# Patient Record
Sex: Female | Born: 1980 | Smoking: Never smoker
Health system: Southern US, Community
[De-identification: ages and names within clinical notes are randomized; demographics above are authoritative.]

---

## 2014-04-29 ENCOUNTER — Other Ambulatory Visit (HOSPITAL_COMMUNITY)
Admission: RE | Admit: 2014-04-29 | Discharge: 2014-04-29 | Disposition: A | Discharge status: Home / Self Care | Payer: BC Managed Care – PPO | Source: Ambulatory Visit | Attending: Family Medicine | Admitting: Family Medicine

## 2014-04-29 DIAGNOSIS — R87810 Cervical high risk human papillomavirus (HPV) DNA test positive: Secondary | ICD-10-CM | POA: Insufficient documentation

## 2014-04-29 DIAGNOSIS — Z1151 Encounter for screening for human papillomavirus (HPV): Secondary | ICD-10-CM | POA: Insufficient documentation

## 2014-04-29 DIAGNOSIS — Z01411 Encounter for gynecological examination (general) (routine) with abnormal findings: Secondary | ICD-10-CM | POA: Diagnosis not present

## 2014-12-23 ENCOUNTER — Other Ambulatory Visit (HOSPITAL_COMMUNITY)
Admission: RE | Admit: 2014-12-23 | Discharge: 2014-12-23 | Disposition: A | Discharge status: Home / Self Care | Payer: BC Managed Care – PPO | Source: Ambulatory Visit | Attending: Family Medicine | Admitting: Family Medicine

## 2014-12-23 ENCOUNTER — Other Ambulatory Visit: Payer: Self-pay | Admitting: Family Medicine

## 2014-12-23 DIAGNOSIS — Z1151 Encounter for screening for human papillomavirus (HPV): Secondary | ICD-10-CM | POA: Insufficient documentation

## 2014-12-23 DIAGNOSIS — Z01411 Encounter for gynecological examination (general) (routine) with abnormal findings: Secondary | ICD-10-CM | POA: Diagnosis present

## 2014-12-27 LAB — CYTOLOGY - PAP

## 2015-12-28 ENCOUNTER — Other Ambulatory Visit (HOSPITAL_COMMUNITY)
Admission: RE | Admit: 2015-12-28 | Discharge: 2015-12-28 | Disposition: A | Discharge status: Home / Self Care | Payer: BC Managed Care – PPO | Source: Ambulatory Visit | Attending: Family Medicine | Admitting: Family Medicine

## 2015-12-28 ENCOUNTER — Other Ambulatory Visit: Payer: Self-pay | Admitting: Family Medicine

## 2015-12-28 DIAGNOSIS — Z113 Encounter for screening for infections with a predominantly sexual mode of transmission: Secondary | ICD-10-CM | POA: Insufficient documentation

## 2015-12-28 DIAGNOSIS — Z01419 Encounter for gynecological examination (general) (routine) without abnormal findings: Secondary | ICD-10-CM | POA: Insufficient documentation

## 2015-12-28 DIAGNOSIS — N760 Acute vaginitis: Secondary | ICD-10-CM | POA: Diagnosis present

## 2015-12-28 DIAGNOSIS — Z1151 Encounter for screening for human papillomavirus (HPV): Secondary | ICD-10-CM | POA: Insufficient documentation

## 2015-12-29 LAB — CYTOLOGY - PAP

## 2018-06-04 ENCOUNTER — Other Ambulatory Visit: Payer: Self-pay | Admitting: Family Medicine

## 2018-06-04 ENCOUNTER — Other Ambulatory Visit (HOSPITAL_COMMUNITY)
Admission: RE | Admit: 2018-06-04 | Discharge: 2018-06-04 | Disposition: A | Discharge status: Home / Self Care | Payer: BC Managed Care – PPO | Source: Ambulatory Visit | Attending: Family Medicine | Admitting: Family Medicine

## 2018-06-04 DIAGNOSIS — Z124 Encounter for screening for malignant neoplasm of cervix: Secondary | ICD-10-CM | POA: Insufficient documentation

## 2018-06-05 LAB — CYTOLOGY - PAP
Diagnosis: NEGATIVE
HPV (WINDOPATH): NOT DETECTED

## 2020-11-29 ENCOUNTER — Encounter (HOSPITAL_COMMUNITY): Payer: Self-pay

## 2020-11-29 ENCOUNTER — Emergency Department (HOSPITAL_COMMUNITY)
Admission: EM | Admit: 2020-11-29 | Discharge: 2020-11-29 | Disposition: A | Discharge status: Home / Self Care | Payer: BC Managed Care – PPO | Attending: Emergency Medicine | Admitting: Emergency Medicine

## 2020-11-29 ENCOUNTER — Emergency Department (HOSPITAL_COMMUNITY): Payer: BC Managed Care – PPO

## 2020-11-29 DIAGNOSIS — R569 Unspecified convulsions: Secondary | ICD-10-CM | POA: Insufficient documentation

## 2020-11-29 DIAGNOSIS — R251 Tremor, unspecified: Secondary | ICD-10-CM | POA: Diagnosis not present

## 2020-11-29 LAB — CBC
HCT: 36.4 % (ref 36.0–46.0)
Hemoglobin: 11.3 g/dL — ABNORMAL LOW (ref 12.0–15.0)
MCH: 25.2 pg — ABNORMAL LOW (ref 26.0–34.0)
MCHC: 31 g/dL (ref 30.0–36.0)
MCV: 81.1 fL (ref 80.0–100.0)
Platelets: 464 10*3/uL — ABNORMAL HIGH (ref 150–400)
RBC: 4.49 MIL/uL (ref 3.87–5.11)
RDW: 17 % — ABNORMAL HIGH (ref 11.5–15.5)
WBC: 10.1 10*3/uL (ref 4.0–10.5)
nRBC: 0 % (ref 0.0–0.2)

## 2020-11-29 LAB — BASIC METABOLIC PANEL
Anion gap: 9 (ref 5–15)
BUN: 16 mg/dL (ref 6–20)
CO2: 23 mmol/L (ref 22–32)
Calcium: 9.2 mg/dL (ref 8.9–10.3)
Chloride: 104 mmol/L (ref 98–111)
Creatinine, Ser: 0.93 mg/dL (ref 0.44–1.00)
GFR, Estimated: >60 mL/min (ref >60)
Glucose, Bld: 82 mg/dL (ref 70–99)
Potassium: 4.2 mmol/L (ref 3.5–5.1)
Sodium: 136 mmol/L (ref 135–145)

## 2020-11-29 LAB — I-STAT BETA HCG BLOOD, ED (MC, WL, AP ONLY): I-stat hCG, quantitative: <5 m[IU]/mL (ref <5)

## 2020-11-29 LAB — T4, FREE: Free T4: 0.98 ng/dL (ref 0.61–1.12)

## 2020-11-29 LAB — TSH: TSH: 1.081 u[IU]/mL (ref 0.350–4.500)

## 2020-11-29 NOTE — ED Triage Notes (Signed)
Pt arrived via EMS, from home, no hx of seizures. Witnessed by family, states it was "full body shaking" approx 2 minutes. On couch entire time, no head strike. Per EMS, pt postictal on arrival.    18 L AC

## 2020-11-29 NOTE — Discharge Instructions (Addendum)
Avoid driving or other activities as we discussed until evaluated by a neurologist.  Return to the ED if you have any recurrent episodes.  Call the neurologist office tomorrow to schedule an appointment

## 2020-11-29 NOTE — ED Notes (Signed)
Discharged no concerns at this time.  °

## 2020-11-29 NOTE — ED Provider Notes (Signed)
[facility]-EMERGENCY DEPT Provider Note   CSN: [id number] Arrival date & time: 11/29/20  1518     History Chief Complaint  Patient presents with   Seizures    Sabrina Hoover is a 40 y.o. female.   Seizures  Patient presented to the ED for evaluation of a probable seizure.  Patient states she remembers being at home watching TV.  She does not recall feeling ill or sick but maybe she did feel little bit lightheaded.  Next thing she remembers that the paramedics at her home.  According to family members and EMS they witnessed a full body shaking episode lasting for 2 minutes.  Patient was unresponsive.  She was on the couch and did not strike her head or injure herself.  When EMS arrived she was confused.  Patient denies having episodes like this before.    History reviewed. No pertinent past medical history.  There are no problems to display for this patient.   History reviewed. No pertinent surgical history.   OB History   No obstetric history on file.     History reviewed. No pertinent family history.  Social History   Tobacco Use   Smoking status: Never   Smokeless tobacco: Never    Home Medications Prior to Admission medications   Not on File    Allergies    Patient has no known allergies.  Review of Systems   Review of Systems  Neurological:  Positive for seizures.  All other systems reviewed and are negative.  Physical Exam Updated Vital Signs BP 128/79   Pulse 86   Temp 98.2 F (36.8 C) (Oral)   Resp 14   LMP 11/16/2020   SpO2 100%   Physical Exam Vitals and nursing note reviewed.  Constitutional:      General: She is not in acute distress.    Appearance: She is well-developed.  HENT:     Head: Normocephalic and atraumatic.     Right Ear: External ear normal.     Left Ear: External ear normal.  Eyes:     General: No scleral icterus.       Right eye: No discharge.        Left eye: No discharge.     Conjunctiva/sclera:  Conjunctivae normal.     Comments: proptosis  Neck:     Trachea: No tracheal deviation.  Cardiovascular:     Rate and Rhythm: Normal rate and regular rhythm.  Pulmonary:     Effort: Pulmonary effort is normal. No respiratory distress.     Breath sounds: Normal breath sounds. No stridor. No wheezing or rales.  Abdominal:     General: Bowel sounds are normal. There is no distension.     Palpations: Abdomen is soft.     Tenderness: There is no abdominal tenderness. There is no guarding or rebound.  Musculoskeletal:        General: No tenderness or deformity.     Cervical back: Neck supple.  Skin:    General: Skin is warm and dry.     Findings: No rash.  Neurological:     General: No focal deficit present.     Mental Status: She is alert.     Cranial Nerves: No cranial nerve deficit (no facial droop, extraocular movements intact, no slurred speech).     Sensory: No sensory deficit.     Motor: No abnormal muscle tone or seizure activity.     Coordination: Coordination normal.  Psychiatric:  Mood and Affect: Mood normal.    ED Results / Procedures / Treatments   Labs (all labs ordered are listed, but only abnormal results are displayed) Labs Reviewed  CBC - Abnormal; Notable for the following components:      Result Value   Hemoglobin 11.3 (*)    MCH 25.2 (*)    RDW 17.0 (*)    Platelets 464 (*)    All other components within normal limits  BASIC METABOLIC PANEL  TSH  T4, FREE  I-STAT BETA HCG BLOOD, ED ([facility], [facility], [facility] ONLY)  CBG MONITORING, ED    EKG None  Radiology CT HEAD WO CONTRAST  Result Date: 11/29/2020 CLINICAL DATA:  Seizure, nontraumatic. Additional history provided: Seizure today, patient currently reports headache. History of seizures in the past. EXAM: CT HEAD WITHOUT CONTRAST TECHNIQUE: Contiguous axial images were obtained from the base of the skull through the vertex without intravenous contrast. COMPARISON:  No pertinent prior exams available for  comparison. FINDINGS: Brain: Cerebral volume is normal. There is no acute intracranial hemorrhage. No demarcated cortical infarct. No extra-axial fluid collection. No evidence of an intracranial mass. No midline shift. Partially empty sella turcica. Vascular: No hyperdense vessel. Skull: Normal. Negative for fracture or focal lesion. Sinuses/Orbits: Visualized orbits show no acute finding. No significant paranasal sinus disease at the imaged levels. IMPRESSION: No evidence of acute intracranial abnormality. Electronically Signed   By: Jackey Loge DO   On: 11/29/2020 16:47    Procedures Procedures   Medications Ordered in ED Medications - No data to display  ED Course  I have reviewed the triage vital signs and the nursing notes.  Pertinent labs & imaging results that were available during my care of the patient were reviewed by me and considered in my medical decision making (see chart for details).  Clinical Course as of 11/30/20 [id number]  Tue Nov 29, 2020  1815 Head CT without acute findings [JK]  1815 Metabolic panel normal.  TSH normal.  Pregnancy test negative [JK]  1815 CBC normal except mild decrease in hemoglobin [JK]    Clinical Course User Index [JK] [name], MD   MDM Rules/Calculators/A&P                          Pt presents with new onset seizure.  Back to baseline in the ED.  No focal neuro deficits.  ED workup reassuring.   No signs of infection, mass, hemorrhage.  Monitored for several hours.  No recurrent seizure.  Outpt neurology referral.  Discussed driving precautions, return for recurrent seizures. Final Clinical Impression(s) / ED Diagnoses Final diagnoses:  Seizure-like activity (HCC)    Rx / DC Orders ED Discharge Orders          Ordered    Ambulatory referral to Neurology       Comments: An appointment is requested in approximately: 1 week New onset seizure.  ED referral   11/29/20 2019             [name], MD 11/30/20 [phone]

## 2020-12-06 ENCOUNTER — Telehealth: Payer: Self-pay | Admitting: Neurology

## 2020-12-06 ENCOUNTER — Encounter: Payer: Self-pay | Admitting: Neurology

## 2020-12-06 ENCOUNTER — Other Ambulatory Visit: Payer: Self-pay

## 2020-12-06 ENCOUNTER — Ambulatory Visit: Payer: BC Managed Care – PPO | Admitting: Neurology

## 2020-12-06 DIAGNOSIS — R569 Unspecified convulsions: Secondary | ICD-10-CM | POA: Diagnosis not present

## 2020-12-06 NOTE — Patient Instructions (Signed)
Seizure, Adult °A seizure is a sudden burst of abnormal electrical and chemical activity in the brain. Seizures usually last from 30 seconds to 2 minutes.  °What are the causes? °Common causes of this condition include: °Fever or infection. °Problems that affect the brain. These may include: °A brain or head injury. °Bleeding in the brain. °A brain tumor. °Low levels of blood sugar or salt. °Kidney problems or liver problems. °Conditions that are passed from parent to child (are inherited). °Problems with a substance, such as: °Having a reaction to a drug or a medicine. °Stopping the use of a substance all of a sudden (withdrawal). °A stroke. °Disorders that affect how you develop. °Sometimes, the cause may not be known.  °What increases the risk? °Having someone in your family who has epilepsy. In this condition, seizures happen again and again over time. They have no clear cause. °Having had a tonic-clonic seizure before. This type of seizure causes you to: °Tighten the muscles of the whole body. °Lose consciousness. °Having had a head injury or strokes before. °Having had a lack of oxygen at birth. °What are the signs or symptoms? °There are many types of seizures. The symptoms vary depending on the type of seizure you have. °Symptoms during a seizure °Shaking that you cannot control (convulsions) with fast, jerky movements of muscles. °Stiffness of the body. °Breathing problems. °Feeling mixed up (confused). °Staring or not responding to sound or touch. °Head nodding. °Eyes that blink, flutter, or move fast. °Drooling, grunting, or making clicking sounds with your mouth °Losing control of when you pee or poop. °Symptoms before a seizure °Feeling afraid, nervous, or worried. °Feeling like you may vomit. °Feeling like: °You are moving when you are not. °Things around you are moving when they are not. °Feeling like you saw or heard something before (déjà vu). °Odd tastes or smells. °Changes in how you see. You may  see flashing lights or spots. °Symptoms after a seizure °Feeling confused. °Feeling sleepy. °Headache. °Sore muscles. °How is this treated? °If your seizure stops on its own, you will not need treatment. If your seizure lasts longer than 5 minutes, you will normally need treatment. Treatment may include: °Medicines given through an IV tube. °Avoiding things, such as medicines, that are known to cause your seizures. °Medicines to prevent seizures. °A device to prevent or control seizures. °Surgery. °A diet low in carbohydrates and high in fat (ketogenic diet). °Follow these instructions at home: °Medicines °Take over-the-counter and prescription medicines only as told by your doctor. °Avoid foods or drinks that may keep your medicine from working, such as alcohol. °Activity °Follow instructions about driving, swimming, or doing things that would be dangerous if you had another seizure. Wait until your doctor says it is safe for you to do these things. °If you live in the [place] ask your local department of motor vehicles when you can drive. °Get a lot of rest. °Teaching others ° °Teach friends and family what to do when you have a seizure. They should: °Help you get down to the ground. °Protect your head and body. °Loosen any clothing around your neck. °Turn you on your side. °Know whether or not you need emergency care. °Stay with you until you are better. °Also, tell them what not to do if you have a seizure. Tell them: °They should not hold you down. °They should not put anything in your mouth. °General instructions °Avoid anything that gives you seizures. °Keep a seizure diary. Write down: °What you remember   about each seizure. °What you think caused each seizure. °Keep all follow-up visits. °Contact a doctor if: °You have another seizure or seizures. Call the doctor each time you have a seizure. °The pattern of your seizures changes. °You keep having seizures with treatment. °You have symptoms of being sick or  having an infection. °You are not able to take your medicine. °Get help right away if: °You have any of these problems: °A seizure that lasts longer than 5 minutes. °Many seizures in a row and you do not feel better between seizures. °A seizure that makes it harder to breathe. °A seizure and you can no longer speak or use part of your body. °You do not wake up right after a seizure. °You get hurt during a seizure. °You feel confused or have pain right after a seizure. °These symptoms may be an emergency. Get help right away. Call your local emergency services (911 in the [place] °Do not wait to see if the symptoms will go away. °Do not drive yourself to the hospital. °Summary °A seizure is a sudden burst of abnormal electrical and chemical activity in the brain. Seizures normally last from 30 seconds to 2 minutes. °Causes of seizures include illness, injury to the head, low levels of blood sugar or salt, and certain conditions. °Most seizures will stop on their own in less than 5 minutes. Seizures that last longer than 5 minutes are a medical emergency and need treatment right away. °Many medicines are used to treat seizures. Take over-the-counter and prescription medicines only as told by your doctor. °This information is not intended to replace advice given to you by your health care provider. Make sure you discuss any questions you have with your health care provider. °Document Revised: 11/13/2019 Document Reviewed: 11/13/2019 °Elsevier Patient Education © 2022 [employer] Inc[employer] ° °

## 2020-12-06 NOTE — Progress Notes (Signed)
Provider:  Melvyn Novas, M D  Referring Provider: [name], MD Primary Care Physician:  Sigmund Hazel, MD  Chief Complaint  Patient presents with   New Patient (Initial Visit)    Pt with mom, rm 12 was in the hospital recently for ? Seizure like activity. Around May is when she noted that something was off. She would suddenly have a headache/foggy headed and difficulty concentrating and getting words out. Denies hx of seizures. Last week mom witnessed her on her phone and her head turned to the rt, eyes went back in head,shaking breathing hard, foaming at mouth. EMS was called. HEAD CT completed    HPI:  Sabrina Hoover is a 40 y.o. female  with african- american heritage is seen here upon ED  referral [name], MD   Based on the emergency room referral note the patient must have been out for a couple of minutes at least she remembers love being at home watching TV on her couch she did not feel sick and then woke up to find the paramedics were in her home.  Family members and EMS witnessed her mouth twisting, her eyes rolling- they stayed open, she was breathing very hard- and then full body stiffness- trembling ,shaking or tonic-clonic activity  and she did  bite her tongue.  She was unresponsive after about 2 minutes of convulsion.  The situation was confusing to her - EMS , asked repeatedly the same question. There was no incontinence and she has no history of head injury and did not suffer a fall or injury in the seizure.     There is no pertinent past seizure history and there is no family history of epilepsy.  He the patient in the emergency room had normal oxygen levels normal pulse rate blood pressure was 128/79 temperature was normal respiration was regular at 49 minutes not elevated.  She was in no acute distress and well developed.  No peripheral pain.  There was no extraocular abnormality no facial droop no slurred speech and her mood was normal she did not appear confused to  the emergency room staff.  The labs that were taken but very basic hemoglobin MCH platelets basic metabolic panel TSH there was no toxicology screen.  An EKG was not obtained, a CT head without contrast was ordered and again it was a nontraumatic seizure without any focal neurologic abnormalities upon returning to baseline this was a normal CT.  The patient also had to undergo a pregnancy test which was negative.  The day was not unusual a in any way. .     Review of Systems:  Little episodes of foggy headaches, lasting less than a minute, and talking " senselessly: started in may , about once a week.   Out of a complete 14 system review, the patient complains of only the following symptoms, and all other reviewed systems are negative.  Single seizure-  Social History   Socioeconomic History   Marital status: Unknown    Spouse name: Not on file   Number of children: Not on file   Years of education: Not on file   Highest education level: Not on file  Occupational History   Not on file  Tobacco Use   Smoking status: Never   Smokeless tobacco: Never  Substance and Sexual Activity   Alcohol use: Not on file   Drug use: Not on file   Sexual activity: Not on file  Other Topics Concern  Not on file  Social History Narrative   Not on file   Social Determinants of Health   Financial Resource Strain: Not on file  Food Insecurity: Not on file  Transportation Needs: Not on file  Physical Activity: Not on file  Stress: Not on file  Social Connections: Not on file  Intimate Partner Violence: Not on file    Allergies as of 12/06/2020   (No Known Allergies)    Vitals: LMP 11/16/2020  Last Weight:  Wt Readings from Last 1 Encounters:  No data found for Wt   Last Height:   Ht Readings from Last 1 Encounters:  No data found for Ht    Physical exam:  General: The patient is awake, alert and appears not in acute distress. The patient is well groomed. Head: Normocephalic,  atraumatic. Neck is supple.  Mallampati 3, nCardiovascular:  Regular rate and rhythm, without  murmurs or carotid bruit, and without distended neck veins. Respiratory: Lungs are clear to auscultation. Skin:  Without evidence of edema, or rash Trunk: BMI is elevated and patient  has normal posture.  Neurologic exam : The patient is awake and alert, oriented to place and time.  Memory subjective  described as intact. There is a normal attention span & concentration ability. Speech is fluent without dysarthria, dysphonia or aphasia. Mood and affect are appropriate.  Cranial nerves: Pupils are equal and briskly reactive to light.  Funduscopic exam without  evidence of pallor or edema. Extraocular movements  in vertical and horizontal planes intact and without nystagmus.  Visual fields by finger perimetry are intact. Hearing to finger rub intact.  Facial sensation intact to fine touch. Facial motor strength is symmetric and tongue and uvula move midline. Right tongue rim bite mark, healed-  Tongue protrusion into either cheek is normal. Shoulder shrug is normal.   Motor exam:   Normal tone ,muscle bulk and symmetric  strength in all extremities. Her left hand grip strength was lower than right, no pronator drift.  Sensory:  Fine touch and vibration were tested in all extremities. Proprioception was normal.  Coordination: Rapid alternating movements in the fingers/hands were normal. Finger-to-nose maneuver  normal without evidence of ataxia, dysmetria or tremor.  Gait and station: Patient walks without assistive device and is able unassisted to climb up to the exam table. Strength within normal limits. Stance is stable and normal. Tandem gait is unfragmented. Romberg testing is negative   Deep tendon reflexes: in the  upper and lower extremities are symmetric and intact. Babinski maneuver response is  downgoing.   Assessment:  After physical and neurologic examination, review of laboratory  studies, imaging, neurophysiology testing and pre-existing records, assessment is that of :  Single , unprovoked seizure-  Plan:  Treatment plan and additional workup :  I believe that the patient indeed suffered a seizure given the tongue bite, the beginning as a mouth twisting face, her eyes remained open, the hands were fisted, then followed by turning and rigid body manifestation with  a tongue bite.  A single seizure is not usually treated with antiepileptic medications, there are too many individuals and will have 1 seizure in the whole lifetime and there is no good guidance when to take the patient back off medication should it be initiated.  My goal today would be to order an MRI of the brain with a special attention to the frontal temporal region, a full metabolic panel, and an EEG "brainwave" test.  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   We spoke about operating machinery, about swimming and eliminating all but one pillow from her bed.  No meds.   Porfirio Mylar Kamiah Fite MD 12/06/2020

## 2020-12-06 NOTE — Telephone Encounter (Signed)
45 mins MRI brain w/wo contrast Dr. Thomasene Lot Berkley Harvey: [phone] exp. 01/04/21 scheduled at [facility] 12/13/20

## 2020-12-13 ENCOUNTER — Ambulatory Visit: Payer: BC Managed Care – PPO

## 2020-12-13 ENCOUNTER — Other Ambulatory Visit: Payer: Self-pay | Admitting: Neurology

## 2020-12-13 DIAGNOSIS — R569 Unspecified convulsions: Secondary | ICD-10-CM | POA: Diagnosis not present

## 2020-12-14 ENCOUNTER — Ambulatory Visit (INDEPENDENT_AMBULATORY_CARE_PROVIDER_SITE_OTHER): Payer: BC Managed Care – PPO

## 2020-12-14 DIAGNOSIS — R569 Unspecified convulsions: Secondary | ICD-10-CM | POA: Diagnosis not present

## 2020-12-16 NOTE — Progress Notes (Signed)
Single seizure, non -traumatic  7.26.2022 study ;Unremarkable MRI brain without contrast.  No acute findings.    INTERPRETING PHYSICIAN: Suanne Marker, MD [id number] 18:10 Certified in Neurology, Neurophysiology and Neuroimaging

## 2020-12-19 ENCOUNTER — Telehealth: Payer: Self-pay | Admitting: Neurology

## 2020-12-19 NOTE — Telephone Encounter (Signed)
EEG was normal.

## 2020-12-19 NOTE — Telephone Encounter (Signed)
Pt called, have seen my MRI results on MyChart, but do not understand the results.Have had another seizure. Would like a call from the nurse.

## 2020-12-19 NOTE — Telephone Encounter (Signed)
Called the pt and advised that the MRI of the brain and EEG (she completed last wed) have not officially been read yet.  Pt states that the seizure occurred on early sat morning during sleep. Informed the patient that I will inform Dr Vickey Huger of this information and advised that once she reviews the results of the tests I will call back with that. Pt verbalized understanding.

## 2020-12-19 NOTE — Progress Notes (Signed)
no epileptiform activity no rhythmicity or periodicity.  Soon after hyperventilation was concluded the study was completed.  The patient did not become drowsy after any of these maneuvers and was always quick to respond to any questions of orientation.    Conclusion this is a normal EEG for the patient's age and conscious state.

## 2020-12-19 NOTE — Procedures (Signed)
Mrs. Sabrina Hoover is a 40 year old female patient who was watching TV at home on her couch and the next thing she remembers is that she was surrounded by paramedics in her home.  Family members and the EMS upon arrival witnessed her's seizing her mouth twisting, her eyes rolling but staying open.  She was breathing mechanically and very hard.  She did bite her tongue.  Total time of convulsion was about 2 minutes with an additional at least 8 minutes of confusion.  This EEG was performed under the international 10-20 placement system of electrodes, electric activity was acquired as a sampling rate of 500 Hz.  There is no synchronous video recording available. single electrode for heart rate and rhythm is part of this set up.  The total recording time for the study was 32 minutes and 15 seconds.  A posterior dominant rhythm was well-established as soon as the patient kept her eyes closed.  During eye opening there was a very high amplitude noticed which attenuated.  Heart rate stayed throughout this recording in normal sinus rhythm between 64 and 78 bpm.  The first maneuver was photic driving to which the patient responded positive at all frequencies there was excessive eye blink artifact noticed as well.  Post photic driving the eye blink returned and the patient must have continued for about 2 minutes before hyperventilation begun.  There was good effort and no significant amplitude buildup noticed.  Again the amplitude was already high, again we see eye blink artifact no epileptiform activity no rhythmicity or periodicity.  Soon after hyperventilation was concluded the study was completed.  The patient did not become drowsy after any of these maneuvers and was always quick to respond to any questions of orientation.    Conclusion this is a normal EEG for the patient's age and conscious state.

## 2020-12-20 ENCOUNTER — Other Ambulatory Visit: Payer: Self-pay | Admitting: Neurology

## 2020-12-20 MED ORDER — LEVETIRACETAM 500 MG PO TABS: ORAL_TABLET | ORAL | 0 refills | Status: DC

## 2020-12-20 NOTE — Telephone Encounter (Signed)
Called the patient back to advise that Dr Vickey Huger did review the EEG results. Informed the patient the EEG was normal finding. "no epileptiform activity no rhythmicity or periodicity.  Soon after hyperventilation was concluded the study was completed.  The patient did not become drowsy after any of these maneuvers and was always quick to respond to any questions of orientation.       Conclusion this is a normal EEG for the patient's age and conscious state." Given the pt has had another event that has occurred the patient should start keppra. Informed that keppra 500 mg BID will be started initially and she should do that for 14 days. If tolerates well she may then increase to 1000 mg BID.  Reviewed side effects with the pt and advised that she should notify if she continues to have seizure like event.  Pt has upcoming apt on 10/3 which will we will keep for now.  Pt verbalized understanding. Pt had no questions at this time but was encouraged to call back if questions arise.

## 2020-12-20 NOTE — Addendum Note (Signed)
Addended by: Judi Cong on: 12/20/2020 09:54 AM   Modules accepted: Orders

## 2021-01-02 ENCOUNTER — Encounter: Payer: Self-pay | Admitting: Neurology

## 2021-01-12 ENCOUNTER — Other Ambulatory Visit: Payer: Self-pay | Admitting: Neurology

## 2021-02-20 ENCOUNTER — Ambulatory Visit: Payer: BC Managed Care – PPO | Admitting: Neurology

## 2021-02-20 ENCOUNTER — Encounter: Payer: Self-pay | Admitting: Neurology

## 2021-02-20 VITALS — BP 126/80 | HR 82 | Ht 64.0 in | Wt 216.0 lb

## 2021-02-20 DIAGNOSIS — F05 Delirium due to known physiological condition: Secondary | ICD-10-CM | POA: Diagnosis not present

## 2021-02-20 DIAGNOSIS — R569 Unspecified convulsions: Secondary | ICD-10-CM

## 2021-02-20 NOTE — Progress Notes (Signed)
Provider:  Melvyn Novas, M D  Referring Provider: Sigmund Hazel, MD Primary Care Physician:  Sigmund Hazel, MD  Chief Complaint  Patient presents with   Follow-up    Pt with mom, rm 11. Presents today post MRI and EEG. Overall has done well/stable. She is on keppra states that she sometimes feels sleepy on it. There have been no seizure like activity according to the pt. Mom reports 2 incidents where she patient had moment of somewhat confusion. This last occurred 10/1- she was writing her moms BP and she asked her mom about the  date and she said October 1st. She went to write it and said :'how do you spell October" and then gazed off and came back to and was fine    HPI:  Sabrina Hoover is a 40 y.o. singe seizure patient - Pt with mom, rm 11. Presents today post MRI and EEG. Overall has done well/stable. She is on keppra states that she sometimes feels sleepy on it. There have been no seizure like activity according to the pt. Mom reports 2 incidents where she patient had moment of somewhat confusion. She can only recall the second incident,occurred 10/1- she was writing her mom's BP down and she couldn't spell October 1st. She went to write it and said how to you spell oct and then gazed off and came back to and was fine. The first spell was mumbling and lasted 3-5 seconds. That's what makes her keep taking keppra.  MRI was normal. EEG was normal.  She has remained on Keppra-      This female  with african- [place] heritage is seen here upon ED  referral [name], MD  Based on the emergency room referral note the patient must have been out for a couple of minutes at least she remembers love being at home watching TV on her couch she did not feel sick and then woke up to find the paramedics were in her home.  Family members and EMS witnessed her mouth twisting, her eyes rolling- they stayed open, she was breathing very hard- and then full body stiffness- trembling ,shaking or  tonic-clonic activity  and she did  bite her tongue.  She was unresponsive after about 2 minutes of convulsion.  The situation was confusing to her - EMS , asked repeatedly the same question. There was no incontinence and she has no history of head injury and did not suffer a fall or injury in the seizure.     There is no pertinent past seizure history and there is no family history of epilepsy.  He the patient in the emergency room had normal oxygen levels normal pulse rate blood pressure was 128/79 temperature was normal respiration was regular at 49 minutes not elevated.  She was in no acute distress and well developed.  No peripheral pain.  There was no extraocular abnormality no facial droop no slurred speech and her mood was normal she did not appear confused to the emergency room staff.  The labs that were taken but very basic hemoglobin MCH platelets basic metabolic panel TSH there was no toxicology screen.  An EKG was not obtained, a CT head without contrast was ordered and again it was a nontraumatic seizure without any focal neurologic abnormalities upon returning to baseline this was a normal CT.  The patient also had to undergo a pregnancy test which was negative.  The day was not unusual a in any way. Marland Kitchen  Review of Systems:  Little episodes of foggy headaches, lasting less than a minute, and talking " senselessly: started in may , about once a week.   Out of a complete 14 system review, the patient complains of only the following symptoms, and all other reviewed systems are negative.  Single seizure-  Social History   Socioeconomic History   Marital status: Unknown    Spouse name: Not on file   Number of children: Not on file   Years of education: Not on file   Highest education level: Not on file  Occupational History   Not on file  Tobacco Use   Smoking status: Never   Smokeless tobacco: Never  Substance and Sexual Activity   Alcohol use: Not on file   Drug use: Not  on file   Sexual activity: Not on file  Other Topics Concern   Not on file  Social History Narrative   Not on file   Social Determinants of Health   Financial Resource Strain: Not on file  Food Insecurity: Not on file  Transportation Needs: Not on file  Physical Activity: Not on file  Stress: Not on file  Social Connections: Not on file  Intimate Partner Violence: Not on file    Allergies as of 39/07/2020   (No Known Allergies)    Vitals: BP 126/80   Pulse 82   Ht 5\' 4"  (1.626 m)   Wt 216 lb (98 kg)   BMI 37.08 kg/m  Last Weight:  Wt Readings from Last 1 Encounters:  02/20/21 216 lb (98 kg)   Last Height:   Ht Readings from Last 1 Encounters:  02/20/21 5\' 4"  (1.626 m)    Physical exam:  General: The patient is awake, alert and appears not in acute distress. The patient is well groomed. Head: Normocephalic, atraumatic. Neck is supple.  Mallampati 3, nCardiovascular:  Regular rate and rhythm, without  murmurs or carotid bruit, and without distended neck veins. Respiratory: Lungs are clear to auscultation. Skin:  Without evidence of edema, or rash Trunk: BMI is elevated and patient  has normal posture.  Neurologic exam : The patient is awake and alert, oriented to place and time.  Memory subjective  described as intact. There is a normal attention span & concentration ability. Speech is fluent without dysarthria, dysphonia or aphasia. Mood and affect are appropriate.  Cranial nerves: Pupils are equal and briskly reactive to light.  Funduscopic exam without  evidence of pallor or edema. Extraocular movements  in vertical and horizontal planes intact and without nystagmus.  Visual fields by finger perimetry are intact. Hearing to finger rub intact.  Facial sensation intact to fine touch. Facial motor strength is symmetric and tongue and uvula move midline. Right tongue rim bite mark, healed-  Tongue protrusion into either cheek is normal. Shoulder shrug is normal.    Motor exam:   Normal tone ,muscle bulk and symmetric  strength in all extremities. Her left hand grip strength was lower than right, no pronator drift.  Sensory:  Fine touch and vibration were tested in all extremities. Proprioception was normal.  Coordination: Rapid alternating movements in the fingers/hands were normal. Finger-to-nose maneuver  normal without evidence of ataxia, dysmetria or tremor.  Gait and station: Patient walks without assistive device and is able unassisted to climb up to the exam table. Strength within normal limits. Stance is stable and normal. Tandem gait is unfragmented. Romberg testing is negative   Deep tendon reflexes: in the  upper and  lower extremities are symmetric and intact. Babinski maneuver response is  downgoing.   Assessment:  After physical and neurologic examination, review of laboratory studies, imaging, neurophysiology testing and pre-existing records, assessment is that of :  Prolonged but unprovoked seizure- followed by 2 spells of confusion, mumbling, not able to spell.  Continues to teach dance, and has not noted side- effects from keppra.   Plan:  Treatment plan and additional workup :  I believe that the patient indeed suffered a seizure given the tongue bite, the beginning as a mouth twisting face, her eyes remained open, the hands were fisted, then followed by turning and rigid body manifestation with  a tongue bite.  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   She needs to manage stress, getting enough sleep and shouldn't drive at night after resuming her privileges in January 2023.   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   We spoke about operating machinery, about swimming and eliminating all but one pillow from her bed.  No meds.   Porfirio Mylar Debhora Titus MD 02/20/2021

## 2021-02-20 NOTE — Patient Instructions (Signed)
Seizure, Adult °A seizure is a sudden burst of abnormal electrical and chemical activity in the brain. Seizures usually last from 30 seconds to 2 minutes.  °What are the causes? °Common causes of this condition include: °Fever or infection. °Problems that affect the brain. These may include: °A brain or head injury. °Bleeding in the brain. °A brain tumor. °Low levels of blood sugar or salt. °Kidney problems or liver problems. °Conditions that are passed from parent to child (are inherited). °Problems with a substance, such as: °Having a reaction to a drug or a medicine. °Stopping the use of a substance all of a sudden (withdrawal). °A stroke. °Disorders that affect how you develop. °Sometimes, the cause may not be known.  °What increases the risk? °Having someone in your family who has epilepsy. In this condition, seizures happen again and again over time. They have no clear cause. °Having had a tonic-clonic seizure before. This type of seizure causes you to: °Tighten the muscles of the whole body. °Lose consciousness. °Having had a head injury or strokes before. °Having had a lack of oxygen at birth. °What are the signs or symptoms? °There are many types of seizures. The symptoms vary depending on the type of seizure you have. °Symptoms during a seizure °Shaking that you cannot control (convulsions) with fast, jerky movements of muscles. °Stiffness of the body. °Breathing problems. °Feeling mixed up (confused). °Staring or not responding to sound or touch. °Head nodding. °Eyes that blink, flutter, or move fast. °Drooling, grunting, or making clicking sounds with your mouth °Losing control of when you pee or poop. °Symptoms before a seizure °Feeling afraid, nervous, or worried. °Feeling like you may vomit. °Feeling like: °You are moving when you are not. °Things around you are moving when they are not. °Feeling like you saw or heard something before (déjà vu). °Odd tastes or smells. °Changes in how you see. You may  see flashing lights or spots. °Symptoms after a seizure °Feeling confused. °Feeling sleepy. °Headache. °Sore muscles. °How is this treated? °If your seizure stops on its own, you will not need treatment. If your seizure lasts longer than 5 minutes, you will normally need treatment. Treatment may include: °Medicines given through an IV tube. °Avoiding things, such as medicines, that are known to cause your seizures. °Medicines to prevent seizures. °A device to prevent or control seizures. °Surgery. °A diet low in carbohydrates and high in fat (ketogenic diet). °Follow these instructions at home: °Medicines °Take over-the-counter and prescription medicines only as told by your doctor. °Avoid foods or drinks that may keep your medicine from working, such as alcohol. °Activity °Follow instructions about driving, swimming, or doing things that would be dangerous if you had another seizure. Wait until your doctor says it is safe for you to do these things. °If you live in the [place] ask your local department of motor vehicles when you can drive. °Get a lot of rest. °Teaching others ° °Teach friends and family what to do when you have a seizure. They should: °Help you get down to the ground. °Protect your head and body. °Loosen any clothing around your neck. °Turn you on your side. °Know whether or not you need emergency care. °Stay with you until you are better. °Also, tell them what not to do if you have a seizure. Tell them: °They should not hold you down. °They should not put anything in your mouth. °General instructions °Avoid anything that gives you seizures. °Keep a seizure diary. Write down: °What you remember   about each seizure. °What you think caused each seizure. °Keep all follow-up visits. °Contact a doctor if: °You have another seizure or seizures. Call the doctor each time you have a seizure. °The pattern of your seizures changes. °You keep having seizures with treatment. °You have symptoms of being sick or  having an infection. °You are not able to take your medicine. °Get help right away if: °You have any of these problems: °A seizure that lasts longer than 5 minutes. °Many seizures in a row and you do not feel better between seizures. °A seizure that makes it harder to breathe. °A seizure and you can no longer speak or use part of your body. °You do not wake up right after a seizure. °You get hurt during a seizure. °You feel confused or have pain right after a seizure. °These symptoms may be an emergency. Get help right away. Call your local emergency services (911 in the [place] °Do not wait to see if the symptoms will go away. °Do not drive yourself to the hospital. °Summary °A seizure is a sudden burst of abnormal electrical and chemical activity in the brain. Seizures normally last from 30 seconds to 2 minutes. °Causes of seizures include illness, injury to the head, low levels of blood sugar or salt, and certain conditions. °Most seizures will stop on their own in less than 5 minutes. Seizures that last longer than 5 minutes are a medical emergency and need treatment right away. °Many medicines are used to treat seizures. Take over-the-counter and prescription medicines only as told by your doctor. °This information is not intended to replace advice given to you by your health care provider. Make sure you discuss any questions you have with your health care provider. °Document Revised: 11/13/2019 Document Reviewed: 11/13/2019 °Elsevier Patient Education © 2022 [employer] Inc[employer] ° °

## 2021-04-09 ENCOUNTER — Other Ambulatory Visit: Payer: Self-pay | Admitting: Neurology

## 2021-05-11 NOTE — Progress Notes (Signed)
Chief Complaint  Patient presents with   Follow-up    RM EMG 3, alone. Last seen 02/20/21. Doing well, no new sx.     HISTORY OF PRESENT ILLNESS:  05/23/21 ALL:  Sabrina Hoover is a 40 y.o. female here today for follow up for previous seizure. She had a witnessed tonic clonic event 11/29/2020. She has continued levetiracetam 1000mg  BID. EEG 12/14/2020 was normal. She has continued to tolerate levetiracetam and denies seizure like activity. She occasionally has word finding difficulty in times of stress or if she is trying to multitask. She does not lose consciousness. She continues to care for her mother who lives with her. She feels mood is stable. She is sleeping fairly well.    HISTORY (copied from Dr Dohmeier's previous note)  HPI:  Sabrina Hoover is a 40 y.o. singe seizure patient - Pt with mom, rm 11. Presents today post MRI and EEG. Overall has done well/stable. She is on keppra states that she sometimes feels sleepy on it. There have been no seizure like activity according to the pt. Mom reports 2 incidents where she patient had moment of somewhat confusion. She can only recall the second incident,occurred 10/1- she was writing her mom's BP down and she couldn't spell October 1st. She went to write it and said how to you spell oct and then gazed off and came back to and was fine. The first spell was mumbling and lasted 3-5 seconds. That's what makes her keep taking keppra.  MRI was normal. EEG was normal.  She has remained on Keppra-    This female  with african- [id number] heritage is seen here upon ED referral [place], MD  Based on the emergency room referral note the patient must have been out for a couple of minutes at least she remembers love being at home watching TV on her couch she did not feel sick and then woke up to find the paramedics were in her home.  Family members and EMS witnessed her mouth twisting, her eyes rolling- they stayed open, she was breathing very hard- and  then full body stiffness- trembling ,shaking or tonic-clonic activity and she did  bite her tongue.  She was unresponsive after about 2 minutes of convulsion.  The situation was confusing to her - EMS , asked repeatedly the same question. There was no incontinence and she has no history of head injury and did not suffer a fall or injury in the seizure.   There is no pertinent past seizure history and there is no family history of epilepsy.  He the patient in the emergency room had normal oxygen levels normal pulse rate blood pressure was 128/79 temperature was normal respiration was regular at 49 minutes not elevated.  She was in no acute distress and well developed.  No peripheral pain.  There was no extraocular abnormality no facial droop no slurred speech and her mood was normal she did not appear confused to the emergency room staff.  The labs that were taken but very basic hemoglobin MCH platelets basic metabolic panel TSH there was no toxicology screen.  An EKG was not obtained, a CT head without contrast was ordered and again it was a nontraumatic seizure without any focal neurologic abnormalities upon returning to baseline this was a normal CT.  The patient also had to undergo a pregnancy test which was negative.   The day was not unusual a in any way.   REVIEW OF SYSTEMS: Out of a  complete 14 system review of symptoms, the patient complains only of the following symptoms, none and all other reviewed systems are negative.   ALLERGIES: No Known Allergies   HOME MEDICATIONS: Outpatient Medications Prior to Visit  Medication Sig Dispense Refill   levETIRAcetam (KEPPRA) 1000 MG tablet TAKE 1 TABLET(1000 MG) BY MOUTH TWICE DAILY 180 tablet 1   No facility-administered medications prior to visit.     PAST MEDICAL HISTORY: History reviewed. No pertinent past medical history.   PAST SURGICAL HISTORY: History reviewed. No pertinent surgical history.   FAMILY HISTORY: History reviewed.  No pertinent family history.   SOCIAL HISTORY: Social History   Socioeconomic History   Marital status: Unknown    Spouse name: Not on file   Number of children: Not on file   Years of education: Not on file   Highest education level: Not on file  Occupational History   Not on file  Tobacco Use   Smoking status: Never   Smokeless tobacco: Never  Substance and Sexual Activity   Alcohol use: Not on file   Drug use: Not on file   Sexual activity: Not on file  Other Topics Concern   Not on file  Social History Narrative   Not on file   Social Determinants of Health   Financial Resource Strain: Not on file  Food Insecurity: Not on file  Transportation Needs: Not on file  Physical Activity: Not on file  Stress: Not on file  Social Connections: Not on file  Intimate Partner Violence: Not on file     PHYSICAL EXAM  Vitals:   05/23/21 1107  BP: (!) 141/88  Pulse: 73  Weight: 207 lb (93.9 kg)  Height: 5\' 4"  (1.626 m)   Body mass index is 35.53 kg/m.  Generalized: Well developed, in no acute distress  Cardiology: normal rate and rhythm, no murmur auscultated  Respiratory: clear to auscultation bilaterally    Neurological examination  Mentation: Alert oriented to time, place, history taking. Follows all commands speech and language fluent Cranial nerve II-XII: Pupils were equal round reactive to light. Extraocular movements were full, visual field were full on confrontational test. Facial sensation and strength were normal. Uvula tongue midline. Head turning and shoulder shrug  were normal and symmetric. Motor: The motor testing reveals 5 over 5 strength of all 4 extremities. Good symmetric motor tone is noted throughout.  Sensory: Sensory testing is intact to soft touch on all 4 extremities. No evidence of extinction is noted.  Coordination: Cerebellar testing reveals good finger-nose-finger and heel-to-shin bilaterally.  Gait and station: Gait is normal.      DIAGNOSTIC DATA (LABS, IMAGING, TESTING) - I reviewed patient records, labs, notes, testing and imaging myself where available.  Lab Results  Component Value Date   WBC 10.1 11/29/2020   HGB 11.3 (L) 11/29/2020   HCT 36.4 11/29/2020   MCV 81.1 11/29/2020   PLT 464 (H) 11/29/2020      Component Value Date/Time   NA 136 11/29/2020 1548   K 4.2 11/29/2020 1548   CL 104 11/29/2020 1548   CO2 23 11/29/2020 1548   GLUCOSE 82 11/29/2020 1548   BUN 16 11/29/2020 1548   CREATININE 0.93 11/29/2020 1548   CALCIUM 9.2 11/29/2020 1548   GFRNONAA >60 11/29/2020 1548   No results found for: CHOL, HDL, LDLCALC, LDLDIRECT, TRIG, CHOLHDL No results found for: 01/30/2021 No results found for: VITAMINB12 Lab Results  Component Value Date   TSH 1.081 11/29/2020  No flowsheet data found.   No flowsheet data found.   ASSESSMENT AND PLAN  40 y.o. year old female  has no past medical history on file. here with    Seizure disorder (HCC)  Arnice reports no further seizure activity since starting levetiracetam. She is tolerating AED well with no obvious adverse effects. She wishes to continue levetiracetam. We will continue 1000mg  BID. She will continue to monitor for any concerns of seizure. Seizure precautions and driving restrictions discussed. She will follow up with me in 6 months.    No orders of the defined types were placed in this encounter.    No orders of the defined types were placed in this encounter.     , MSN, FNP-C 05/23/2021, 12:17 PM  [facility] [address], [address] [place], [place] [phone]

## 2021-05-11 NOTE — Patient Instructions (Signed)
Below is our plan:  We will continue levetiracetam 1000mg  twice daily.   Please make sure you are staying well hydrated. I recommend 50-60 ounces daily. Well balanced diet and regular exercise encouraged. Consistent sleep schedule with 6-8 hours recommended.   Please continue follow up with care team as directed.   Follow up with me in 6 months   You may receive a survey regarding today's visit. I encourage you to leave honest feed back as I do use this information to improve patient care. Thank you for seeing me today!   Management of Memory Problems   There are some general things you can do to help manage your memory problems.  Your memory may not in fact recover, but by using techniques and strategies you will be able to manage your memory difficulties better.   1)  Establish a routine. Try to establish and then stick to a regular routine.  By doing this, you will get used to what to expect and you will reduce the need to rely on your memory.  Also, try to do things at the same time of day, such as taking your medication or checking your calendar first thing in the morning. Think about think that you can do as a part of a regular routine and make a list.  Then enter them into a daily planner to remind you.  This will help you establish a routine.   2)  Organize your environment. Organize your environment so that it is uncluttered.  Decrease visual stimulation.  Place everyday items such as keys or cell phone in the same place every day (ie.  Basket next to front door) Use post it notes with a brief message to yourself (ie. Turn off light, lock the door) Use labels to indicate where things go (ie. Which cupboards are for food, dishes, etc.) Keep a notepad and pen by the telephone to take messages   3)  Memory Aids A diary or journal/notebook/daily planner Making a list (shopping list, chore list, to do list that needs to be done) Using an alarm as a reminder (kitchen timer or cell  phone alarm) Using cell phone to store information (Notes, Calendar, Reminders) Calendar/White board placed in a prominent position Post-it notes   In order for memory aids to be useful, you need to have good habits.  It's no good remembering to make a note in your journal if you don't remember to look in it.  Try setting aside a certain time of day to look in journal.   4)  Improving mood and managing fatigue. There may be other factors that contribute to memory difficulties.  Factors, such as anxiety, depression and tiredness can affect memory. Regular gentle exercise can help improve your mood and give you more energy. Simple relaxation techniques may help relieve symptoms of anxiety Try to get back to completing activities or hobbies you enjoyed doing in the past. Learn to pace yourself through activities to decrease fatigue. Find out about some local support groups where you can share experiences with others. Try and achieve 7-8 hours of sleep at night.

## 2021-05-23 ENCOUNTER — Encounter: Payer: Self-pay | Admitting: Family Medicine

## 2021-05-23 ENCOUNTER — Ambulatory Visit: Payer: BC Managed Care – PPO | Admitting: Family Medicine

## 2021-05-23 VITALS — BP 141/88 | HR 73 | Ht 64.0 in | Wt 207.0 lb

## 2021-05-23 DIAGNOSIS — G40909 Epilepsy, unspecified, not intractable, without status epilepticus: Secondary | ICD-10-CM

## 2021-08-26 ENCOUNTER — Telehealth: Payer: Self-pay | Admitting: Adult Health

## 2021-08-26 MED ORDER — LEVETIRACETAM 1000 MG PO TABS: ORAL_TABLET | ORAL | 1 refills | Status: DC

## 2021-08-26 NOTE — Telephone Encounter (Signed)
Patients mother called the after hours service. reports that she had a seizure event. States that her legs and arms got stiff. Eyes rolled back and had heavy breathing. Lasted about 5 minutes according to mom but she did not actually time it.  ? ?Continues on Keppra 1000 mg BID. Denies missed medication. Not sick. Sleeping ok. ? ?Suggested that we could increase her Keppra 1000 mg in the AM and 1500mg  at bedtime. Initially the patient wanted to wait till Monday to speak with Dr. Brett Fairy but ultimately she decided to increase her medicine.  ? ?I advised that she should not operate a motor vehicle until she is seizure free for 6 months.  ? ? ?

## 2021-08-28 NOTE — Telephone Encounter (Signed)
Pt wants to know if she is to continue taking one and a half tablets of Keppra during the evening. Would like a call back.  ?

## 2021-08-28 NOTE — Telephone Encounter (Signed)
Called pt back. Advised I spoke w/ Dr. Vickey Huger who recommends she stay at Keppra 1000mg  po q am and 1500mg  po qhs. She should keep next f/u 11/20/21 and call if she has any more episodes prior to this appt.  ?She will make sure to stay well hydrated. She did found out recent news that caused increase in stress. Advised stress can contribute to cause for seizure activity. She will call back if she has any more questions or concerns. ?

## 2021-09-27 ENCOUNTER — Other Ambulatory Visit: Payer: Self-pay | Admitting: Neurology

## 2021-10-03 ENCOUNTER — Telehealth: Payer: Self-pay | Admitting: Family Medicine

## 2021-10-03 NOTE — Telephone Encounter (Signed)
[place] 7/3 appt with pt over the phone- NP out. ?

## 2021-10-30 ENCOUNTER — Other Ambulatory Visit: Payer: Self-pay | Admitting: Family Medicine

## 2021-10-30 DIAGNOSIS — Z1231 Encounter for screening mammogram for malignant neoplasm of breast: Secondary | ICD-10-CM

## 2021-11-13 ENCOUNTER — Telehealth: Payer: Self-pay | Admitting: Family Medicine

## 2021-11-15 ENCOUNTER — Encounter: Payer: Self-pay | Admitting: Neurology

## 2021-11-15 ENCOUNTER — Ambulatory Visit: Payer: BC Managed Care – PPO | Admitting: Neurology

## 2021-11-15 VITALS — BP 134/80 | HR 74 | Ht 64.0 in | Wt 215.5 lb

## 2021-11-15 DIAGNOSIS — R0681 Apnea, not elsewhere classified: Secondary | ICD-10-CM

## 2021-11-15 DIAGNOSIS — R0683 Snoring: Secondary | ICD-10-CM | POA: Diagnosis not present

## 2021-11-15 DIAGNOSIS — R569 Unspecified convulsions: Secondary | ICD-10-CM | POA: Diagnosis not present

## 2021-11-15 DIAGNOSIS — G4714 Hypersomnia due to medical condition: Secondary | ICD-10-CM | POA: Diagnosis not present

## 2021-11-15 DIAGNOSIS — E66812 Obesity, class 2: Secondary | ICD-10-CM

## 2021-11-15 DIAGNOSIS — Z6836 Body mass index (BMI) 36.0-36.9, adult: Secondary | ICD-10-CM

## 2021-11-15 DIAGNOSIS — E669 Obesity, unspecified: Secondary | ICD-10-CM

## 2021-11-15 MED ORDER — LAMOTRIGINE 100 MG PO TABS: 100.0000 mg | ORAL_TABLET | Freq: Two times a day (BID) | ORAL | 2 refills | Status: DC

## 2021-11-15 MED ORDER — LEVETIRACETAM 1000 MG PO TABS: ORAL_TABLET | ORAL | 1 refills | Status: DC

## 2021-11-15 MED ORDER — LAMOTRIGINE 25 MG PO TABS: ORAL_TABLET | ORAL | 1 refills | Status: DC

## 2021-11-15 NOTE — Addendum Note (Signed)
Addended by: Melvyn Novas on: 11/15/2021 04:41 PM   Modules accepted: Orders

## 2021-11-15 NOTE — Progress Notes (Signed)
Provider:  Melvyn Novas, M D  Referring Provider: Sigmund Hazel, MD Primary Care Physician:  Sigmund Hazel, MD  Chief Complaint  Patient presents with   Follow-up    Pt with mom, rm 12. Presents today post MRI and EEG. Overall has done well/stable. She is on keppra states that she sometimes feels sleepy on it. There have been no seizure like activity according to the pt. Mom reports 2 incidents where she patient had moment of somewhat confusion. This last occurred 10/1- she was writing her moms BP and she asked her mom about the  date and she said October 1st. She went to write it and said :'how do you spell October" and then gazed off and came back to and was fine    HPI:  Sabrina Hoover is a 41 y.o. AAF seizure patient - here after a repeat seizure in the night from 11-13-25, and her Keppra dose was increased by doc on call to 1.5 tabs bid po. 1500 mg bid.  I wonder if Keppra alone will control her seizures. She had a previous in April while watching TV and one in July 2022 in her sleep.  The increase has left her sleepier.  Ms. 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  It may have a focal onset.  I strongly feel that we need to add a second medication and I would recommend Lamictal also generically known as lamotrigine.  I will titrate slowly over 6 weeks to the goal dose of 100 mg twice a day.  My hope is that Lamictal will actually be the seizure controlling agent and that we would be able to reduce the Keppra to a level that she does not feel sedated or overly sleepy and fatigued.  Besides seizure control this also will help to reduce seizure related accidents, and all the difficulties that arise from driving restrictions etc.   She has migraines with her menstrual cycle, and wonders if that affects her seizure threshold, and she snores loudly without waking her up, that will need investigation.  She  does not drink, not drive.      Pt with mom, rm 12. Presents today post MRI and EEG. Overall has done well/stable. She is on keppra states that she sometimes feels sleepy on it. There have been no seizure like activity according to the pt. Mom reports 2 incidents where she patient had moment of somewhat confusion. She can only recall the second incident,occurred 10/1- she was writing her mom's BP down and she couldn't spell October 1st. She went to write it and said how to you spell oct and then gazed off and came back to and was fine. The first spell was mumbling and lasted 3-5 seconds. That's what makes her keep taking keppra.  MRI was normal. EEG was normal.  She has remained on Keppra-      This female  with african- [place] heritage is seen here upon ED  referral [name], MD  Based on the emergency room referral note the patient must have been out for a couple of minutes at least she remembers love being at home watching TV on her couch she did not feel sick and then woke up to find the paramedics were in her home.  Family members and EMS witnessed her mouth twisting, her eyes rolling- they stayed open, she was breathing very hard- and then full  body stiffness- trembling ,shaking or tonic-clonic activity  and she did  bite her tongue.  She was unresponsive after about 2 minutes of convulsion.  The situation was confusing to her - EMS , asked repeatedly the same question. There was no incontinence and she has no history of head injury and did not suffer a fall or injury in the seizure.     There is no pertinent past seizure history and there is no family history of epilepsy.  He the patient in the emergency room had normal oxygen levels normal pulse rate blood pressure was 128/79 temperature was normal respiration was regular at 49 minutes not elevated.  She was in no acute distress and well developed.  No peripheral pain.  There was no extraocular abnormality no facial droop no slurred  speech and her mood was normal she did not appear confused to the emergency room staff.  The labs that were taken but very basic hemoglobin MCH platelets basic metabolic panel TSH there was no toxicology screen.  An EKG was not obtained, a CT head without contrast was ordered and again it was a nontraumatic seizure without any focal neurologic abnormalities upon returning to baseline this was a normal CT.  The patient also had to undergo a pregnancy test which was negative.  The day was not unusual a in any way. .     Review of Systems:  Little episodes of foggy headaches, lasting less than a minute, and talking " senselessly: started in may , about once a week.   Out of a complete 14 system review, the patient complains of only the following symptoms, and all other reviewed systems are negative.  Repeated  seizure-  Likes caffeine.   How likely are you to doze in the following situations: 0 = not likely, 1 = slight chance, 2 = moderate chance, 3 = high chance  Sitting and Reading?1 Watching Television? 3 Sitting inactive in a public place (theater or meeting)?0 Lying down in the afternoon when circumstances permit?3 Sitting and talking to someone?0 Sitting quietly after lunch without alcohol?2 In a car, while stopped for a few minutes in traffic?0 As a passenger in a car for an hour without a break?2  Total = 11  Social History   Socioeconomic History   Marital status: Unknown    Spouse name: Not on file   Number of children: Not on file   Years of education: Not on file   Highest education level: Not on file  Occupational History   Not on file  Tobacco Use   Smoking status: Never   Smokeless tobacco: Never  Substance and Sexual Activity   Alcohol use: Not on file   Drug use: Not on file   Sexual activity: Not on file  Other Topics Concern   Not on file  Social History Narrative   Not on file   Social Determinants of Health   Financial Resource Strain: Not on  file  Food Insecurity: Not on file  Transportation Needs: Not on file  Physical Activity: Not on file  Stress: Not on file  Social Connections: Not on file  Intimate Partner Violence: Not on file    Allergies as of 11/15/2021   (No Known Allergies)    Vitals: BP 134/80   Pulse 74   Ht 5\' 4"  (1.626 m)   Wt 215 lb 8 oz (97.8 kg)   BMI 36.99 kg/m  Last Weight:  Wt Readings from Last 1 Encounters:  11/15/21 215  lb 8 oz (97.8 kg)   Last Height:   Ht Readings from Last 1 Encounters:  11/15/21 5\' 4"  (1.626 m)    Physical exam:  General: The patient is awake, alert and appears not in acute distress. The patient is well groomed. Head: Normocephalic, atraumatic. Neck is supple. Neck size: 15".  Mallampati 3,   Cardiovascular:  Regular rate and rhythm, without  murmurs or carotid bruit, and without distended neck veins. Respiratory: Lungs are clear to auscultation. Skin:  Without evidence of edema, or rash Trunk: BMI is elevated and patient  has normal posture.  Neurologic exam : The patient is awake and alert, oriented to place and time.  Memory subjective  described as intact. There is a normal attention span & concentration ability.  Speech is fluent without dysarthria, dysphonia or aphasia.  Mood and affect are appropriate.  Cranial nerves: Pupils are equal and briskly reactive to light.  Funduscopic exam without  evidence of pallor or edema. Extraocular movements  in vertical and horizontal planes intact and without nystagmus.  Visual fields by finger perimetry are intact. Hearing to finger rub intact.  Facial sensation intact to fine touch. Facial motor strength is symmetric and tongue and uvula move midline. Right tongue rim bite mark, healed-  Tongue protrusion into either cheek is normal. Shoulder shrug is normal.   Motor exam:   Normal tone ,muscle bulk and symmetric  strength in all extremities. Her left hand grip strength was lower than right, no pronator drift.   Sensory:  Fine touch and vibration were tested in all extremities. Proprioception was normal.  Coordination: Rapid alternating movements in the fingers/hands were normal. Finger-to-nose maneuver  normal without evidence of ataxia, dysmetria or tremor.  Gait and station: Patient walks without assistive device and is able unassisted to climb up to the exam table. Strength within normal limits. Stance is stable and normal.  Tandem gait is unfragmented. Romberg testing is negative   Deep tendon reflexes: in the  upper and lower extremities are symmetric and intact. Babinski maneuver response is  downgoing.   Assessment:  After physical and neurologic examination, review of laboratory studies, imaging, neurophysiology testing and pre-existing records, assessment is that of :  Prolonged repeated  seizures- while on keppra Snoring and EDS, Migraine with menstrual cycle, end of the cycle onset.   Plan:  Treatment plan and additional workup :  Lamictal titration.: start with 25 mg once a day for 1 week, follow week 2 for 25 mg bid po, week 3 , take  50 mg bid po, Week  4: 75 mg bid po.  Week 5 100 mg bid po.   Advised of risk of skin blisters, stevens johnson.   Keep on Keppra 1500 mg bid for now.   Ordered a sleep test with EEG montage.   I believe that the patient indeed suffered a seizure given the tongue bite, the beginning as a mouth twisting face, her eyes remained open, the hands were fisted, then followed by turning and rigid body manifestation with  a tongue bite.    She is a high in [place], living in [place], driving restriction are in place for 6 month, that is [place] law.   She needs to manage stress, getting enough sleep and shouldn't drive at night after resuming her privileges in January 2023.   She is a February 2023- I would not restrict that activity.   We spoke about operating machinery, about swimming and eliminating all but one  pillow from her bed.  No meds.    Porfirio Mylar Lukka Black MD 11/15/2021

## 2021-11-15 NOTE — Patient Instructions (Addendum)
Document Revised: 06/03/2020 Document Reviewed: 01/27/2021 Elsevier Patient Education  2022 Elsevier Inc. Lamotrigine Tablets What is this medication? LAMOTRIGINE (la MOE Patrecia Pace) prevents and controls seizures in people with epilepsy. It may also be used to treat bipolar disorder. It works by calming overactive nerves in your body. This medicine may be used for other purposes; ask your health care provider or pharmacist if you have questions. COMMON BRAND NAME(S): Lamictal, Subvenite What should I tell my care team before I take this medication? They need to know if you have any of these conditions: Heart disease History of irregular heartbeat Immune system problems Kidney disease Liver disease Low levels of folic acid in the blood Lupus Mental illness Suicidal thoughts, plans, or attempt; a previous suicide attempt by you or a family member An unusual or allergic reaction to lamotrigine or other seizure medications, other medications, foods, dyes, or preservatives Pregnant or trying to get pregnant Breast-feeding How should I use this medication? Take this medication by mouth with a glass of water. Follow the directions on the prescription label. Do not chew these tablets. If this medication upsets your stomach, take it with food or milk. Take your doses at regular intervals. Do not take your medication more often than directed. A special MedGuide will be given to you by the pharmacist with each new prescription and refill. Be sure to read this information carefully each time. Talk to your care team about the use of this medication in children. While this medication may be prescribed for children as young as 2 years for selected conditions, precautions do apply. Overdosage: If you think you have taken too much of this medicine contact a poison control center or emergency room at once. NOTE: This medicine is only for you. Do not share this medicine with others. What if I miss a  dose? If you miss a dose, take it as soon as you can. If it is almost time for your next dose, take only that dose. Do not take double or extra doses. What may interact with this medication? Atazanavir Birth control pills Certain medications for irregular heartbeat Certain medications for seizures like carbamazepine, phenobarbital, phenytoin, primidone, valproic acid Lopinavir Rifampin Ritonavir This list may not describe all possible interactions. Give your health care provider a list of all the medicines, herbs, non-prescription drugs, or dietary supplements you use. Also tell them if you smoke, drink alcohol, or use illegal drugs. Some items may interact with your medicine. What should I watch for while using this medication? Visit your care team for regular checks on your progress. If you take this medication for seizures, wear a Medic Alert bracelet or necklace. Carry an identification card with information about your condition, medications, and care team. It is important to take this medication exactly as directed. When first starting treatment, your dose will need to be adjusted slowly. It may take weeks or months before your dose is stable. You should contact your care team if your seizures get worse or if you have any new types of seizures. Do not stop taking this medication unless instructed by your care team. Stopping your medication suddenly can increase your seizures or their severity. This medication may cause serious skin reactions. They can happen weeks to months after starting the medication. Contact your care team right away if you notice fevers or flu-like symptoms with a rash. The rash may be red or purple and then turn into blisters or peeling of the skin. Or, you might notice a  red rash with swelling of the face, lips or lymph nodes in your neck or under your arms. You may get drowsy, dizzy, or have blurred vision. Do not drive, use machinery, or do anything that needs mental  alertness until you know how this medication affects you. To reduce dizzy or fainting spells, do not sit or stand up quickly, especially if you are an older patient. Alcohol can increase drowsiness and dizziness. Avoid alcoholic drinks. If you are taking this medication for bipolar disorder, it is important to report any changes in your mood to your care team. If your condition gets worse, you get mentally depressed, feel very hyperactive or manic, have difficulty sleeping, or have thoughts of hurting yourself or committing suicide, you need to get help from your care team right away. If you are a caregiver for someone taking this medication for bipolar disorder, you should also report these behavioral changes right away. The use of this medication may increase the chance of suicidal thoughts or actions. Pay special attention to how you are responding while on this medication. Your mouth may get dry. Chewing sugarless gum or sucking hard candy, and drinking plenty of water may help. Contact your care team if the problem does not go away or is severe. Women who become pregnant while using this medication may enroll in the [place] American Antiepileptic Drug Pregnancy Registry by calling [phone]. This registry collects information about the safety of antiepileptic medication use during pregnancy. This medication may cause a decrease in folic acid. You should make sure that you get enough folic acid while you are taking this medication. Discuss the foods you eat and the vitamins you take with your care team. What side effects may I notice from receiving this medication? Side effects that you should report to your care team as soon as possible: Allergic reactions--skin rash, itching, hives, swelling of the face, lips, tongue, or throat Change in vision Fever, neck pain or stiffness, sensitivity to light, headache, nausea, vomiting, confusion Heart rhythm changes--fast or irregular heartbeat, dizziness,  feeling faint or lightheaded, chest pain, trouble breathing Infection--fever, chills, cough, or sore throat Liver injury--right upper belly pain, loss of appetite, nausea, light-colored stool, dark yellow or brown urine, yellowing skin or eyes, unusual weakness or fatigue Low red blood cell count--unusual weakness or fatigue, dizziness, headache, trouble breathing Rash, fever, and swollen lymph nodes Redness, blistering, peeling or loosening of the skin, including inside the mouth Thoughts of suicide or self-harm, worsening mood, or feelings of depression Unusual bruising or bleeding Side effects that usually do not require medical attention (report to your care team if they continue or are bothersome): Diarrhea Dizziness Drowsiness Headache Nausea Stomach pain Tremors or shaking This list may not describe all possible side effects. Call your doctor for medical advice about side effects. You may report side effects to FDA at 1-800-FDA-1088. Where should I keep my medication? Keep out of the reach of children and pets. Store at [employer] [address] (77 degrees F). Protect from light. Get rid of any unused medication after the expiration date. To get rid of medications that are no longer needed or have expired: Take the medication to a medication take-back program. Check with your pharmacy or law enforcement to find a location. If you cannot return the medication, check the label or package insert to see if the medication should be thrown out in the garbage or flushed down the toilet. If you are not sure, ask your care team. If it is  safe to put it in the trash, empty the medication out of the container. Mix the medication with cat litter, dirt, coffee grounds, or other unwanted substance. Seal the mixture in a bag or container. Put it in the trash. NOTE: This sheet is a summary. It may not cover all possible information. If you have questions about this medicine, talk to your doctor, pharmacist, or  health care provider.  2023 Elsevier/Gold Standard (2021-04-07 00:00:00)    Assessment:  After physical and neurologic examination, review of laboratory studies, imaging, neurophysiology testing and pre-existing records, assessment is that of :  Prolonged repeated  seizures- while on keppra Snoring and EDS, Migraine with menstrual cycle, end of the cycle onset.   Plan:  Treatment plan and additional workup :  Lamictal titration.: start with 25 mg once a day for 1 week, follow week 2 for 25 mg bid po, week 3 , take  50 mg bid po, Week  4: 75 mg bid po.  Week 5 100 mg bid po.   Advised of risk of skin blisters, stevens johnson.   Keep on Keppra 1500 mg bid for now.   Ordered a sleep test with EEG montage.    Seizure, Adult A seizure is a sudden burst of abnormal electrical and chemical activity in the brain. Seizures usually last from 30 seconds to 2 minutes.  What are the causes? Common causes of this condition include: Fever or infection. Problems that affect the brain. These may include: A brain or head injury. Bleeding in the brain. A brain tumor. Low levels of blood sugar or salt. Kidney problems or liver problems. Conditions that are passed from parent to child (are inherited). Problems with a substance, such as: Having a reaction to a drug or a medicine. Stopping the use of a substance all of a sudden (withdrawal). A stroke. Disorders that affect how you develop. Sometimes, the cause may not be known.  What increases the risk? Having someone in your family who has epilepsy. In this condition, seizures happen again and again over time. They have no clear cause. Having had a tonic-clonic seizure before. This type of seizure causes you to: Tighten the muscles of the whole body. Lose consciousness. Having had a head injury or strokes before. Having had a lack of oxygen at birth. What are the signs or symptoms? There are many types of seizures. The symptoms vary  depending on the type of seizure you have. Symptoms during a seizure Shaking that you cannot control (convulsions) with fast, jerky movements of muscles. Stiffness of the body. Breathing problems. Feeling mixed up (confused). Staring or not responding to sound or touch. Head nodding. Eyes that blink, flutter, or move fast. Drooling, grunting, or making clicking sounds with your mouth Losing control of when you pee or poop. Symptoms before a seizure Feeling afraid, nervous, or worried. Feeling like you may vomit. Feeling like: You are moving when you are not. Things around you are moving when they are not. Feeling like you saw or heard something before (dj vu). Odd tastes or smells. Changes in how you see. You may see flashing lights or spots. Symptoms after a seizure Feeling confused. Feeling sleepy. Headache. Sore muscles. How is this treated? If your seizure stops on its own, you will not need treatment. If your seizure lasts longer than 5 minutes, you will normally need treatment. Treatment may include: Medicines given through an IV tube. Avoiding things, such as medicines, that are known to cause your seizures. Medicines  to prevent seizures. A device to prevent or control seizures. Surgery. A diet low in carbohydrates and high in fat (ketogenic diet). Follow these instructions at home: Medicines Take over-the-counter and prescription medicines only as told by your doctor. Avoid foods or drinks that may keep your medicine from working, such as alcohol. Activity Follow instructions about driving, swimming, or doing things that would be dangerous if you had another seizure. Wait until your doctor says it is safe for you to do these things. If you live in the [place] ask your local department of motor vehicles when you can drive. Get a lot of rest. Teaching others  Teach friends and family what to do when you have a seizure. They should: Help you get down to the  ground. Protect your head and body. Loosen any clothing around your neck. Turn you on your side. Know whether or not you need emergency care. Stay with you until you are better. Also, tell them what not to do if you have a seizure. Tell them: They should not hold you down. They should not put anything in your mouth. General instructions Avoid anything that gives you seizures. Keep a seizure diary. Write down: What you remember about each seizure. What you think caused each seizure. Keep all follow-up visits. Contact a doctor if: You have another seizure or seizures. Call the doctor each time you have a seizure. The pattern of your seizures changes. You keep having seizures with treatment. You have symptoms of being sick or having an infection. You are not able to take your medicine. Get help right away if: You have any of these problems: A seizure that lasts longer than 5 minutes. Many seizures in a row and you do not feel better between seizures. A seizure that makes it harder to breathe. A seizure and you can no longer speak or use part of your body. You do not wake up right after a seizure. You get hurt during a seizure. You feel confused or have pain right after a seizure. These symptoms may be an emergency. Get help right away. Call your local emergency services (911 in the [place] Do not wait to see if the symptoms will go away. Do not drive yourself to the hospital. Summary A seizure is a sudden burst of abnormal electrical and chemical activity in the brain. Seizures normally last from 30 seconds to 2 minutes. Causes of seizures include illness, injury to the head, low levels of blood sugar or salt, and certain conditions. Most seizures will stop on their own in less than 5 minutes. Seizures that last longer than 5 minutes are a medical emergency and need treatment right away. Many medicines are used to treat seizures. Take over-the-counter and prescription medicines only  as told by your doctor. This information is not intended to replace advice given to you by your health care provider. Make sure you discuss any questions you have with your health care provider. Document Revised: 11/13/2019 Document Reviewed: 11/13/2019 Elsevier Patient Education  2023 [employer].

## 2021-11-16 ENCOUNTER — Telehealth: Payer: Self-pay | Admitting: Neurology

## 2021-11-16 NOTE — Telephone Encounter (Signed)
[employer] Seizure Montage- [employer] [place] no auth req. Patient is scheduled at [facility] for 11/30/21 at 8 pm.

## 2021-11-20 ENCOUNTER — Ambulatory Visit: Payer: BC Managed Care – PPO | Admitting: Family Medicine

## 2021-11-27 ENCOUNTER — Ambulatory Visit: Payer: BC Managed Care – PPO | Admitting: Family Medicine

## 2021-11-30 ENCOUNTER — Ambulatory Visit (INDEPENDENT_AMBULATORY_CARE_PROVIDER_SITE_OTHER): Payer: BC Managed Care – PPO | Admitting: Neurology

## 2021-11-30 DIAGNOSIS — E669 Obesity, unspecified: Secondary | ICD-10-CM | POA: Diagnosis not present

## 2021-11-30 DIAGNOSIS — Z6836 Body mass index (BMI) 36.0-36.9, adult: Secondary | ICD-10-CM

## 2021-11-30 DIAGNOSIS — G4714 Hypersomnia due to medical condition: Secondary | ICD-10-CM

## 2021-11-30 DIAGNOSIS — R569 Unspecified convulsions: Secondary | ICD-10-CM

## 2021-11-30 DIAGNOSIS — R0681 Apnea, not elsewhere classified: Secondary | ICD-10-CM

## 2021-11-30 DIAGNOSIS — R0683 Snoring: Secondary | ICD-10-CM

## 2021-11-30 DIAGNOSIS — G4710 Hypersomnia, unspecified: Secondary | ICD-10-CM

## 2021-12-06 ENCOUNTER — Telehealth: Payer: Self-pay | Admitting: *Deleted

## 2021-12-06 NOTE — Procedures (Signed)
PATIENT'S NAME:  Sabrina Hoover DOB:      1980-07-07      MR#:    [id number]     DATE OF RECORDING: 11/30/2021 REFERRING M.D.:  Iantha Fallen, MD Study Performed:   expanded EEG  Polysomnogram HISTORY:  Sabrina Hoover is a 41 y.o. seizure patient -presented o 11-13-2021 with  Prolonged repeated seizures- while on Keppra Also reports Snoring and EDS, one seizure occurred in sleep  Migraine with menstrual cycle, end of the cycle onset.    Plan:  Treatment plan and additional workup :   Lamictal titration.: start with 25 mg once a day for 1 week, follow week 2 for 25 mg bid po, week 3 , take  50 mg bid po, Week  4: 75 mg bid po.  Week 5 100 mg bid po. Advised of risk of skin blisters, Stevens-Johnson Syndrome.  Keep on Keppra 1500 mg bid for now. Ordered a sleep test with EEG montage.   I believe that the patient indeed suffered a seizure given the tongue bite, the beginning as a mouth twisting face, her eyes remained open, the hands were fisted, then followed by turning and rigid body manifestation with a tongue bite.  The patient endorsed the Epworth Sleepiness Scale at 11/24 points.   The patient's weight 215 pounds with a height of 64 (inches), resulting in a BMI of 36.9 kg/m2. The patient's neck circumference measured 15 inches.  CURRENT MEDICATIONS: Lamictal, Keppra   PROCEDURE:  This is a multichannel digital polysomnogram utilizing the Somnostar 11.2 system.  Electrodes and sensors were applied and monitored per AASM Specifications.   EEG, EOG, Chin and Limb EMG, were sampled at 200 Hz.  ECG, Snore and Nasal Pressure, Thermal Airflow, Respiratory Effort, CPAP Flow and Pressure, Oximetry was sampled at 50 Hz. Digital video and audio were recorded.      BASELINE STUDY: Lights Out was at 22:08 and Lights On at 05:01.  Total recording time (TRT) was 413.5 minutes, with a total sleep time (TST) of 295.5 minutes.   The patient's sleep latency was 54.5 minutes.  REM latency was 175.5 minutes.  The sleep  efficiency was 71.5 %.     SLEEP ARCHITECTURE: WASO (Wake after sleep onset) was 63.5 minutes.  There were 14 minutes in Stage N1, 242 minutes Stage N2, 0 minutes Stage N3 and 39.5 minutes in Stage REM.  The percentage of Stage N1 was 4.7%, Stage N2 was 81.9%, Stage N3 was 0% and Stage R (REM sleep) was 13.4%.   RESPIRATORY ANALYSIS:  There were a total of 17 respiratory events:  16 obstructive apneas, 0 central apneas and 0 mixed apneas with a total of 16 apneas and an apnea index (AI) of 3.2 /hour. There were 1 hypopneas with a hypopnea index of 0.2 /hour. The patient also had some respiratory event related arousals (RERAs).      The total APNEA/HYPOPNEA INDEX (AHI) was 3.5/hour.  14 events occurred in REM sleep and 2 events in NREM.  The REM AHI was  21.3 /hour, versus a non-REM AHI of .7. The patient spent 14.5 minutes of total sleep time in the supine position and 281 minutes in non-supine.  The supine AHI was 0.0 versus a non-supine AHI of 3.6/h.  OXYGEN SATURATION & C02:  The Wake baseline 02 saturation was 97%, with the lowest being 88%. Time spent below 89% saturation equaled 0.1 minutes.  The arousals were noted as: 23 were spontaneous, 0 were associated with PLMs, 1 were  associated with respiratory events.   PERIODIC LIMB MOVEMENTS:  The patient had a total of 6 Periodic Limb Movements, no arousal.    Audio and video analysis did not show any abnormal or unusual movements, behaviors, phonations or vocalizations.    The patient took bathroom breaks. Very mild Snoring was intermittently noted until REM sleep onset- here snoring was moderate and . EKG was in keeping with normal sinus rhythm (NSR).   IMPRESSION:  No OSA, no hypoxia, only mild snoring. All respiratory events were clustered in REM sleep. No evidence of Periodic Limb Movement Disorder (PLMD) Normal sleep EEG.     RECOMMENDATIONS: the patient snores and has 2% saturation drops in oxygen in REM sleep only, this could  be characterized as UARS, but does not qualify for apnea diagnosis.  Weight loss is most important to reduce the REM sleep apnea.    I certify that I have reviewed the entire raw data recording prior to the issuance of this report in accordance with the Standards of Accreditation of the American Academy of Sleep Medicine (AASM)   Melvyn Novas, MD Medical Director, [facility], American Board of Neurology and Sleep Medicine (Neurology and Sleep Medicine)

## 2021-12-06 NOTE — Telephone Encounter (Signed)
-----   Message from Melvyn Novas, MD sent at 12/06/2021  4:28 PM EDT ----- IMPRESSION:  1. No OSA, no hypoxia, only mild snoring. All respiratory events were clustered in REM sleep. No sleep hypoxia.  2. No evidence of Periodic Limb Movement Disorder (PLMD) 3. Normal sleep EEG.     RECOMMENDATIONS: the patient snores and has 2% saturation drops in oxygen in REM sleep only, this could be characterized as UARS, but does not qualify for apnea diagnosis.  Weight loss is most important to reduce the REM sleep apnea.

## 2021-12-22 ENCOUNTER — Ambulatory Visit: Payer: PRIVATE HEALTH INSURANCE

## 2021-12-28 ENCOUNTER — Ambulatory Visit
Admission: RE | Admit: 2021-12-28 | Discharge: 2021-12-28 | Disposition: A | Discharge status: Home / Self Care | Payer: PRIVATE HEALTH INSURANCE | Source: Ambulatory Visit | Attending: Family Medicine | Admitting: Family Medicine

## 2021-12-28 DIAGNOSIS — Z1231 Encounter for screening mammogram for malignant neoplasm of breast: Secondary | ICD-10-CM

## 2021-12-29 ENCOUNTER — Other Ambulatory Visit: Payer: Self-pay | Admitting: Family Medicine

## 2021-12-29 DIAGNOSIS — R928 Other abnormal and inconclusive findings on diagnostic imaging of breast: Secondary | ICD-10-CM

## 2022-01-17 ENCOUNTER — Ambulatory Visit
Admission: RE | Admit: 2022-01-17 | Discharge: 2022-01-17 | Disposition: A | Discharge status: Home / Self Care | Payer: PRIVATE HEALTH INSURANCE | Source: Ambulatory Visit | Attending: Family Medicine | Admitting: Family Medicine

## 2022-01-17 ENCOUNTER — Ambulatory Visit
Admission: RE | Admit: 2022-01-17 | Discharge: 2022-01-17 | Disposition: A | Discharge status: Home / Self Care | Payer: BC Managed Care – PPO | Source: Ambulatory Visit | Attending: Family Medicine | Admitting: Family Medicine

## 2022-01-17 ENCOUNTER — Other Ambulatory Visit: Payer: Self-pay | Admitting: Family Medicine

## 2022-01-17 ENCOUNTER — Other Ambulatory Visit: Payer: PRIVATE HEALTH INSURANCE

## 2022-01-17 DIAGNOSIS — R928 Other abnormal and inconclusive findings on diagnostic imaging of breast: Secondary | ICD-10-CM

## 2022-01-17 DIAGNOSIS — N6320 Unspecified lump in the left breast, unspecified quadrant: Secondary | ICD-10-CM

## 2022-02-08 NOTE — Patient Instructions (Signed)
Below is our plan:  We will continue lamotrigine 100mg  and levetiracetam 1500mg  twice daily. Take medication with food.   Please make sure you are consistent with timing of seizure medication. I recommend annual visit with primary care provider (PCP) for complete physical and routine blood work. I recommend daily intake of vitamin D (400-800iu) and calcium (800-1000mg ) for bone health. Discuss Dexa screening with PCP.   According to [place] law, you can not drive unless you are seizure / syncope free for at least 6 months and under physician's care.  Please maintain precautions. Do not participate in activities where a loss of awareness could harm you or someone else. No swimming alone, no tub bathing, no hot tubs, no driving, no operating motorized vehicles (cars, ATVs, motocycles, etc), lawnmowers, power tools or firearms. No standing at heights, such as rooftops, ladders or stairs. Avoid hot objects such as stoves, heaters, open fires. Wear a helmet when riding a bicycle, scooter, skateboard, etc. and avoid areas of traffic. Set your water heater to 120 degrees or less.  Please make sure you are staying well hydrated. I recommend 50-60 ounces daily. Well balanced diet and regular exercise encouraged. Consistent sleep schedule with 6-8 hours recommended.   Please continue follow up with care team as directed.   Follow up with me in 6 months   You may receive a survey regarding today's visit. I encourage you to leave honest feed back as I do use this information to improve patient care. Thank you for seeing me today!

## 2022-02-08 NOTE — Progress Notes (Signed)
Chief Complaint  Patient presents with   Follow-up    RM 1 w/ family.  No seizures since last visit. Having dizziness sometimes after taking medication (last about 5 min and resolves). Otherwise, doing ok.     HISTORY OF PRESENT ILLNESS:  02/13/22 ALL:  Sabrina Hoover returns for follow up for seizures. She was last seen by me 05/2021 and doing well on levetiracetam. She called to report seizure 11/13/2021 and levetiracetam dose was increased to 1500mg  BID. She was seen by Dr 11/15/2021 who added lamotrigine titrating to 100mg  BID and ordered sleep study. Sleep results did not reveal any significant sleep breathing disorders. Since, she reports doing well. No seizure activity. She is tolerating meds well with the exception of mild dizziness for about 5 minutes after taking lamotrigine. She reports dizziness resolves within minutes and is not limiting. She is not taking meds with food. She continues to work.   05/23/2021 ALL: Sabrina Hoover is a 41 y.o. female here today for follow up for previous seizure. She had a witnessed tonic clonic event 11/29/2020. She has continued levetiracetam 1000mg  BID. EEG 12/14/2020 was normal. She has continued to tolerate levetiracetam and denies seizure like activity. She occasionally has word finding difficulty in times of stress or if she is trying to multitask. She does not lose consciousness. She continues to care for her mother who lives with her. She feels mood is stable. She is sleeping fairly well.   HISTORY (copied from Dr Dohmeier's previous note)  HPI:  Sabrina Hoover is a 41 y.o. singe seizure patient - Pt with mom, rm 11. Presents today post MRI and EEG. Overall has done well/stable. She is on keppra states that she sometimes feels sleepy on it. There have been no seizure like activity according to the pt. Mom reports 2 incidents where she patient had moment of somewhat confusion. She can only recall the second incident,occurred 10/1- she was writing her  mom's BP down and she couldn't spell October 1st. She went to write it and said how to you spell oct and then gazed off and came back to and was fine. The first spell was mumbling and lasted 3-5 seconds. That's what makes her keep taking keppra.  MRI was normal. EEG was normal.  She has remained on Keppra-    This female  with african- Sabrina Hoover heritage is seen here upon ED referral 24, MD  Based on the emergency room referral note the patient must have been out for a couple of minutes at least she remembers love being at home watching TV on her couch she did not feel sick and then woke up to find the paramedics were in her home.  Family members and EMS witnessed her mouth twisting, her eyes rolling- they stayed open, she was breathing very hard- and then full body stiffness- trembling ,shaking or tonic-clonic activity and she did  bite her tongue.  She was unresponsive after about 2 minutes of convulsion.  The situation was confusing to her - EMS , asked repeatedly the same question. There was no incontinence and she has no history of head injury and did not suffer a fall or injury in the seizure.   There is no pertinent past seizure history and there is no family history of epilepsy.  He the patient in the emergency room had normal oxygen levels normal pulse rate blood pressure was 128/79 temperature was normal respiration was regular at 49 minutes not elevated.  She was in  no acute distress and well developed.  No peripheral pain.  There was no extraocular abnormality no facial droop no slurred speech and her mood was normal she did not appear confused to the emergency room staff.  The labs that were taken but very basic hemoglobin MCH platelets basic metabolic panel TSH there was no toxicology screen.  An EKG was not obtained, a CT head without contrast was ordered and again it was a nontraumatic seizure without any focal neurologic abnormalities upon returning to baseline this was a normal CT.   The patient also had to undergo a pregnancy test which was negative.   The day was not unusual a in any way.   REVIEW OF SYSTEMS: Out of a complete 14 system review of symptoms, the patient complains only of the following symptoms, none and all other reviewed systems are negative.   ALLERGIES: No Known Allergies   HOME MEDICATIONS: Outpatient Medications Prior to Visit  Medication Sig Dispense Refill   lamoTRIgine (LAMICTAL) 100 MG tablet Take 1 tablet (100 mg total) by mouth 2 (two) times daily. 180 tablet 2   levETIRAcetam (KEPPRA) 1000 MG tablet 1.5 tablet in AM and 1.5 tablets at bedtime 225 tablet 1   lamoTRIgine (LAMICTAL) 25 MG tablet Lamictal titration.: start with 25 mg once a day for 1 week, follow week 2 for 25 mg bid po, week 3 , take  50 mg bid po, Week  4:  3 tablets or 75 mg bid po.   Week 5: 4 tab or  100 mg bid po. 250 tablet 1   No facility-administered medications prior to visit.     PAST MEDICAL HISTORY: History reviewed. No pertinent past medical history.   PAST SURGICAL HISTORY: History reviewed. No pertinent surgical history.   FAMILY HISTORY: Family History  Problem Relation Age of Onset   Breast cancer Neg Hx      SOCIAL HISTORY: Social History   Socioeconomic History   Marital status: Unknown    Spouse name: Not on file   Number of children: Not on file   Years of education: Not on file   Highest education level: Not on file  Occupational History   Not on file  Tobacco Use   Smoking status: Never   Smokeless tobacco: Never  Substance and Sexual Activity   Alcohol use: Not on file   Drug use: Not on file   Sexual activity: Not on file  Other Topics Concern   Not on file  Social History Narrative   Not on file   Social Determinants of Health   Financial Resource Strain: Not on file  Food Insecurity: Not on file  Transportation Needs: Not on file  Physical Activity: Not on file  Stress: Not on file  Social Connections: Not  on file  Intimate Partner Violence: Not on file     PHYSICAL EXAM  Vitals:   02/13/22 0901  BP: 120/80  Weight: 214 lb (97.1 kg)  Height: 5\' 4"  (1.626 m)    Body mass index is 36.73 kg/m.  Generalized: Well developed, in no acute distress  Cardiology: normal rate and rhythm, no murmur auscultated  Respiratory: clear to auscultation bilaterally    Neurological examination  Mentation: Alert oriented to time, place, history taking. Follows all commands speech and language fluent Cranial nerve II-XII: Pupils were equal round reactive to light. Extraocular movements were full, visual field were full on confrontational test. Facial sensation and strength were normal. Uvula tongue midline. Head turning and  shoulder shrug  were normal and symmetric. Motor: The motor testing reveals 5 over 5 strength of all 4 extremities. Good symmetric motor tone is noted throughout.  Sensory: Sensory testing is intact to soft touch on all 4 extremities. No evidence of extinction is noted.  Coordination: Cerebellar testing reveals good finger-nose-finger and heel-to-shin bilaterally.  Gait and station: Gait is normal.     DIAGNOSTIC DATA (LABS, IMAGING, TESTING) - I reviewed patient records, labs, notes, testing and imaging myself where available.  Lab Results  Component Value Date   WBC 10.1 11/29/2020   HGB 11.3 (L) 11/29/2020   HCT 36.4 11/29/2020   MCV 81.1 11/29/2020   PLT 464 (H) 11/29/2020      Component Value Date/Time   NA 136 11/29/2020 1548   K 4.2 11/29/2020 1548   CL 104 11/29/2020 1548   CO2 23 11/29/2020 1548   GLUCOSE 82 11/29/2020 1548   BUN 16 11/29/2020 1548   CREATININE 0.93 11/29/2020 1548   CALCIUM 9.2 11/29/2020 1548   GFRNONAA >60 11/29/2020 1548   No results found for: "CHOL", "HDL", "LDLCALC", "LDLDIRECT", "TRIG", "CHOLHDL" No results found for: "HGBA1C" No results found for: "VITAMINB12" Lab Results  Component Value Date   TSH 1.081 11/29/2020         No data to display               No data to display           ASSESSMENT AND PLAN  41 y.o. year old female  has no past medical history on file. 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 She is tolerating AEDs well with mild dizziness. I have encouraged her to take medications with food. May consider extended release in the future. We will update labs. She will to continue levetiracetam 1500mg  and lamotrigine 100mg  BID. She will continue to monitor for any concerns of seizure. Seizure precautions and driving restrictions discussed. She will follow up with me in 6 months.    Orders Placed This Encounter  Procedures   Lamotrigine level   CBC with Differential/Platelets   CMP   Levetiracetam level     No orders of the defined types were placed in this encounter.     Sabrina Presto, MSN, FNP-C 02/13/2022, 9:55 AM  [facility] [address] [place], [place] [address] [phone]

## 2022-02-13 ENCOUNTER — Ambulatory Visit: Payer: BC Managed Care – PPO | Admitting: Family Medicine

## 2022-02-13 ENCOUNTER — Encounter: Payer: Self-pay | Admitting: Family Medicine

## 2022-02-13 VITALS — BP 120/80 | Ht 64.0 in | Wt 214.0 lb

## 2022-02-13 DIAGNOSIS — R569 Unspecified convulsions: Secondary | ICD-10-CM

## 2022-02-15 LAB — COMPREHENSIVE METABOLIC PANEL
ALT: 11 IU/L (ref 0–32)
AST: 15 IU/L (ref 0–40)
Albumin/Globulin Ratio: 1.5 (ref 1.2–2.2)
Albumin: 4.4 g/dL (ref 3.9–4.9)
Alkaline Phosphatase: 55 IU/L (ref 44–121)
BUN/Creatinine Ratio: 9 (ref 9–23)
BUN: 9 mg/dL (ref 6–24)
Bilirubin Total: 0.3 mg/dL (ref 0.0–1.2)
CO2: 21 mmol/L (ref 20–29)
Calcium: 9.2 mg/dL (ref 8.7–10.2)
Chloride: 105 mmol/L (ref 96–106)
Creatinine, Ser: 1 mg/dL (ref 0.57–1.00)
Globulin, Total: 2.9 g/dL (ref 1.5–4.5)
Glucose: 83 mg/dL (ref 70–99)
Potassium: 4.5 mmol/L (ref 3.5–5.2)
Sodium: 141 mmol/L (ref 134–144)
Total Protein: 7.3 g/dL (ref 6.0–8.5)
eGFR: 73 mL/min/{1.73_m2} (ref >59)

## 2022-02-15 LAB — CBC WITH DIFFERENTIAL/PLATELET
Basophils Absolute: 0.1 10*3/uL (ref 0.0–0.2)
Basos: 1 %
EOS (ABSOLUTE): 0.1 10*3/uL (ref 0.0–0.4)
Eos: 2 %
Hematocrit: 34 % (ref 34.0–46.6)
Hemoglobin: 10.4 g/dL — ABNORMAL LOW (ref 11.1–15.9)
Immature Grans (Abs): 0 10*3/uL (ref 0.0–0.1)
Immature Granulocytes: 0 %
Lymphocytes Absolute: 1.9 10*3/uL (ref 0.7–3.1)
Lymphs: 28 %
MCH: 23 pg — ABNORMAL LOW (ref 26.6–33.0)
MCHC: 30.6 g/dL — ABNORMAL LOW (ref 31.5–35.7)
MCV: 75 fL — ABNORMAL LOW (ref 79–97)
Monocytes Absolute: 0.3 10*3/uL (ref 0.1–0.9)
Monocytes: 4 %
Neutrophils Absolute: 4.5 10*3/uL (ref 1.4–7.0)
Neutrophils: 65 %
Platelets: 489 10*3/uL — ABNORMAL HIGH (ref 150–450)
RBC: 4.53 x10E6/uL (ref 3.77–5.28)
RDW: 16.6 % — ABNORMAL HIGH (ref 11.7–15.4)
WBC: 6.8 10*3/uL (ref 3.4–10.8)

## 2022-02-15 LAB — LAMOTRIGINE LEVEL: Lamotrigine Lvl: 8.9 ug/mL (ref 2.0–20.0)

## 2022-02-15 LAB — LEVETIRACETAM LEVEL: Levetiracetam Lvl: 57.7 ug/mL — ABNORMAL HIGH (ref 10.0–40.0)

## 2022-03-07 ENCOUNTER — Telehealth: Payer: Self-pay | Admitting: Family Medicine

## 2022-03-07 MED ORDER — LEVETIRACETAM 1000 MG PO TABS: ORAL_TABLET | ORAL | 1 refills | Status: DC

## 2022-03-07 NOTE — Telephone Encounter (Signed)
Pt called wanting a refill request for her levETIRAcetam (KEPPRA) 1000 MG tablet sent in to the [facility] on [address] [place]

## 2022-03-07 NOTE — Telephone Encounter (Signed)
Refill sent for the patient to the pharmacy requested 

## 2022-06-04 IMAGING — CT CT HEAD W/O CM
4 series · 17 of 47 positions shown, 19 images · non-contrast
Comparison: No pertinent prior exams available for comparison.

CLINICAL DATA: Seizure, nontraumatic. Additional history provided:
Seizure today, patient currently reports headache. History of
seizures in the past.

EXAM:
CT HEAD WITHOUT CONTRAST
TECHNIQUE: Contiguous axial images were obtained from the base of the skull
through the vertex without intravenous contrast.

[Series 2: head wo · axial · 0.46mm/px · z∈[+1483,+1603]mm · 7 of 32 slices shown, 9 images]
[im 4/32  brain]
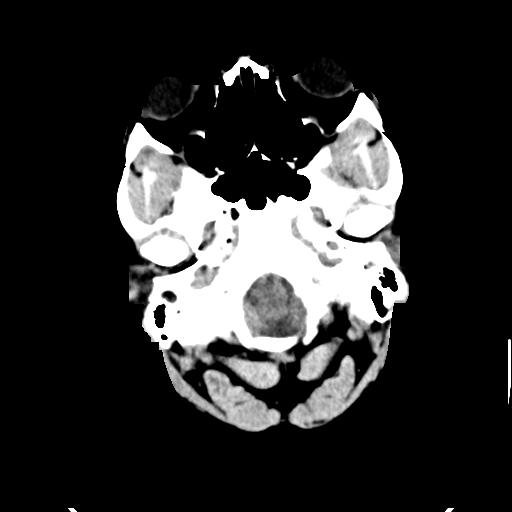
[im 4/32  bone]
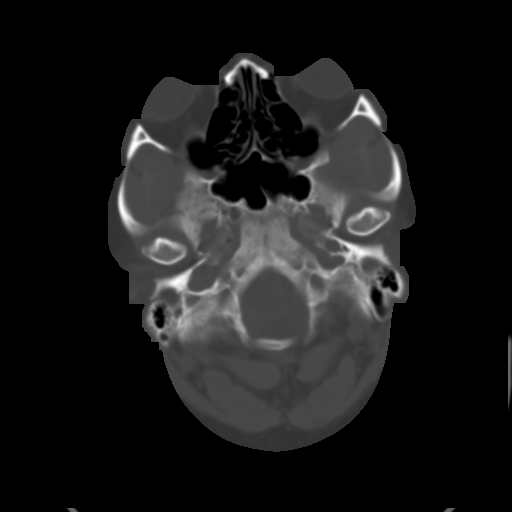
[im 8/32  brain]
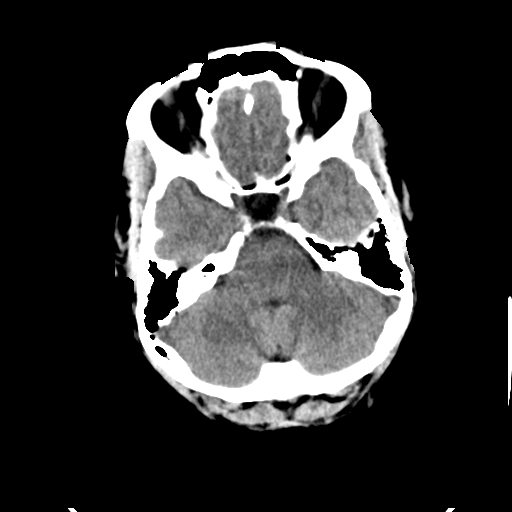
[im 12/32  brain]
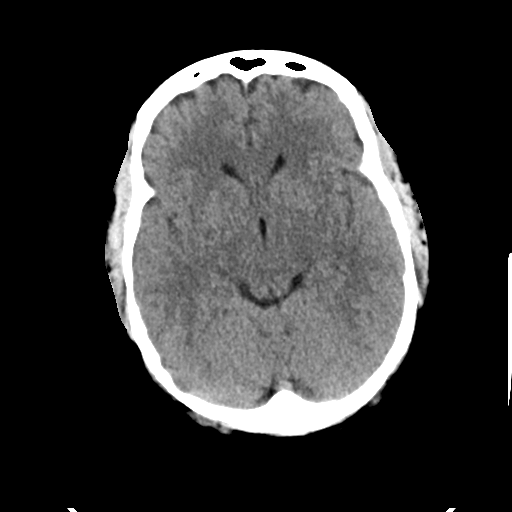
[im 16/32  brain]
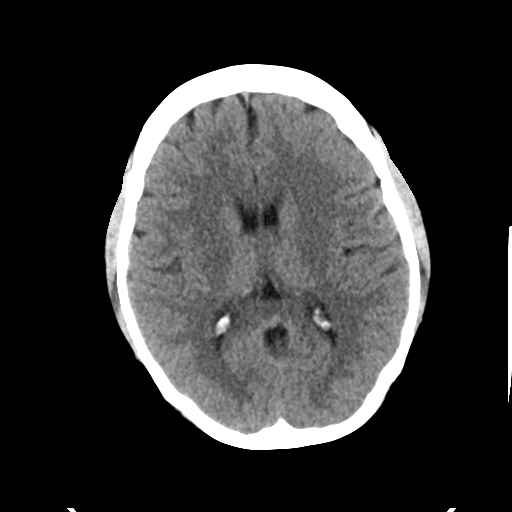
[im 20/32  brain]
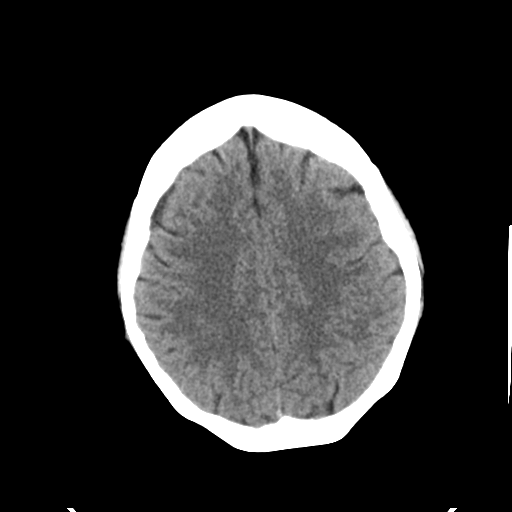
[im 20/32  bone]
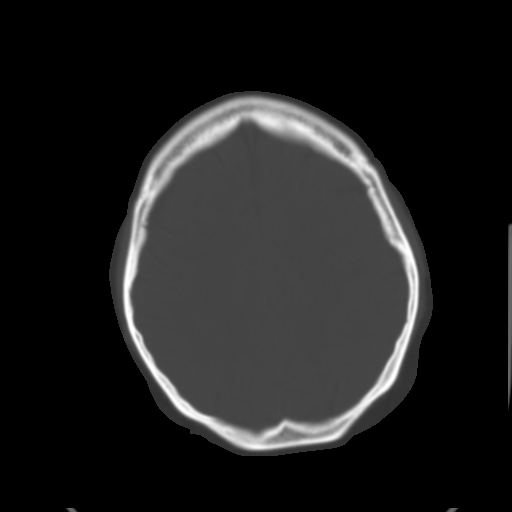
[im 24/32  brain]
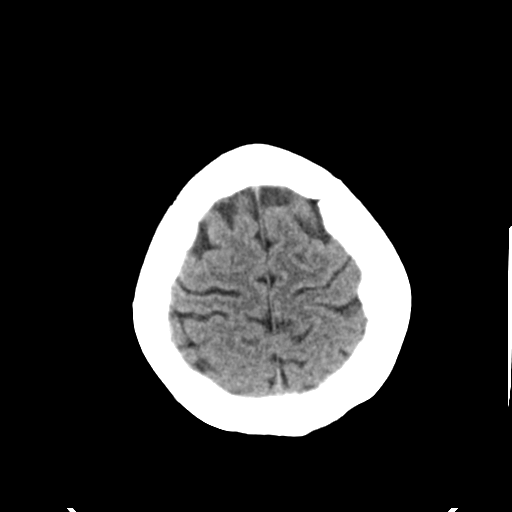
[im 28/32  brain]
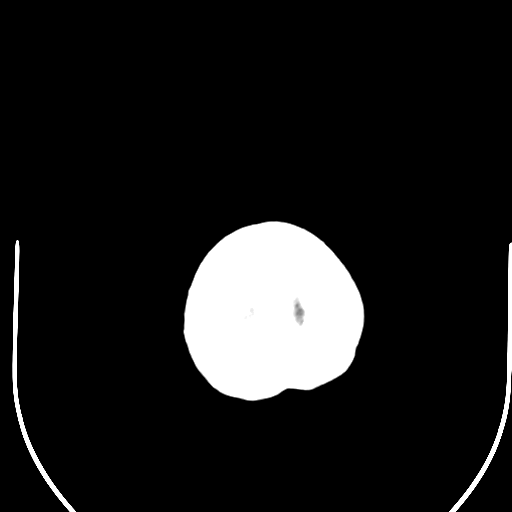

[Series 3: head bone · axial · 0.46mm/px · z∈[+1482,+1538]mm · 4 of 79 slices shown]
[im 8/79  bone]
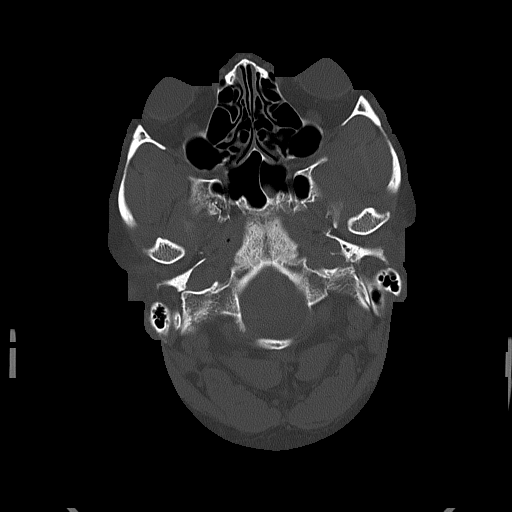
[im 16/79  bone]
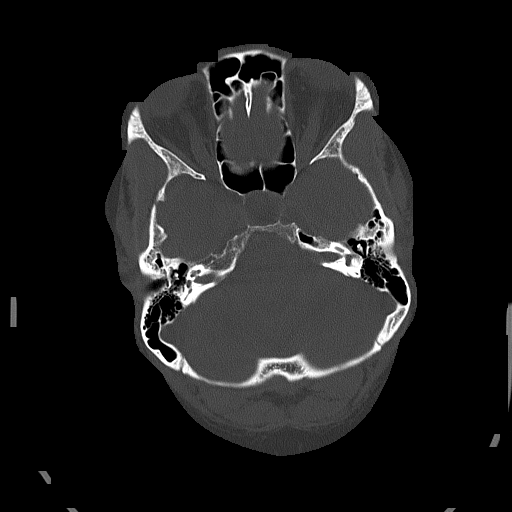
[im 24/79  bone]
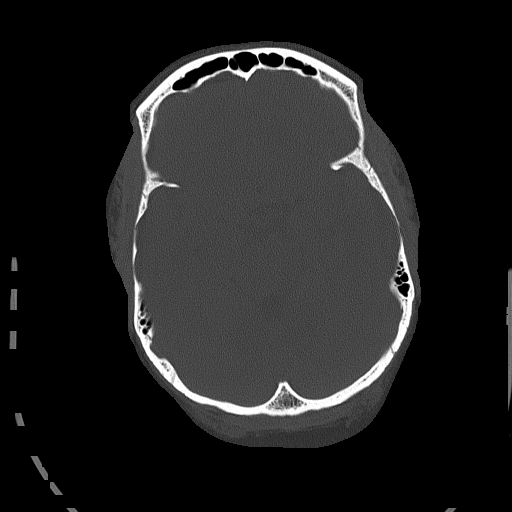
[im 36/79  bone]
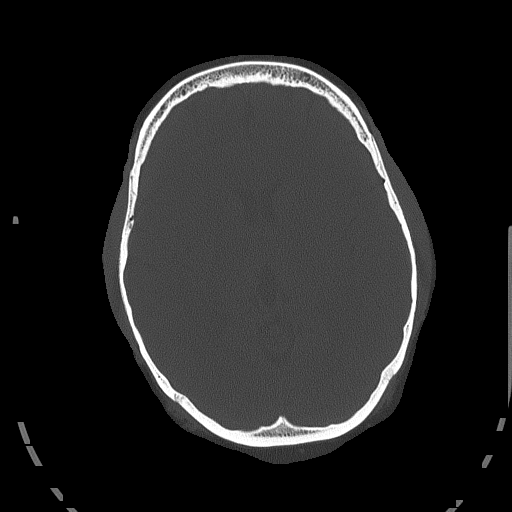

[Series 4: coronal soft tissue · coronal · 0.34mm/px · 3 of 72 slices shown]
[im 24/72  brain]
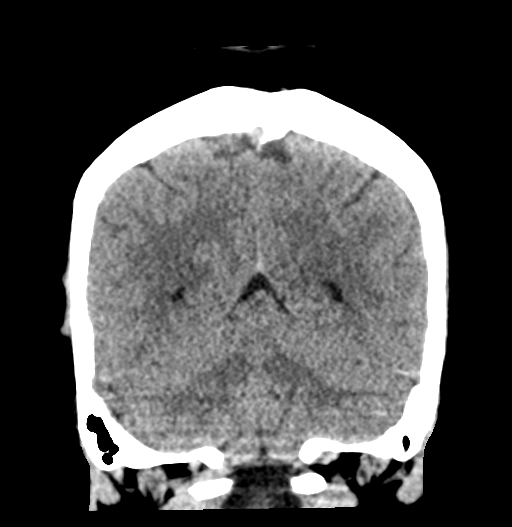
[im 32/72  brain]
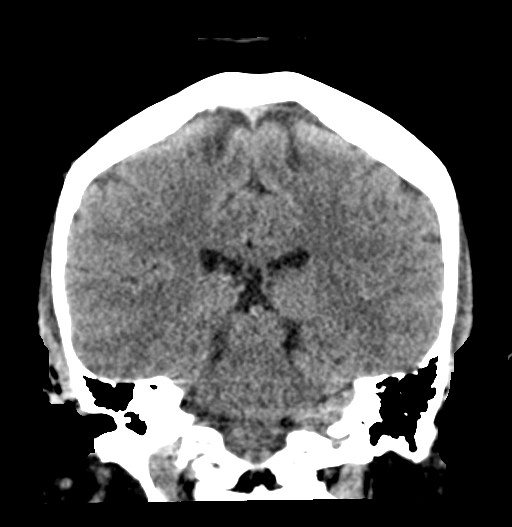
[im 40/72  brain]
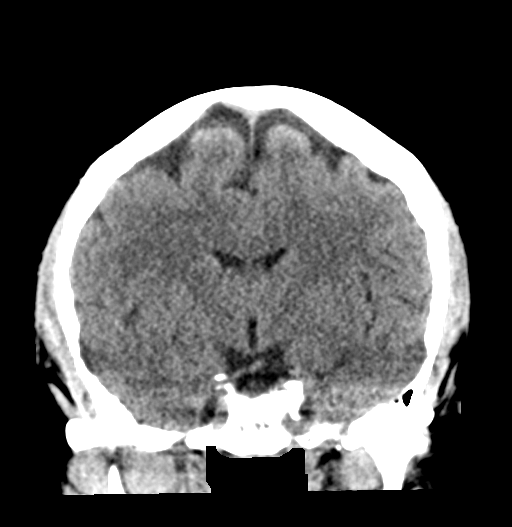

[Series 5: sagittal soft tissue · sagittal · 0.33mm/px · 3 of 60 slices shown]
[im 20/60  brain]
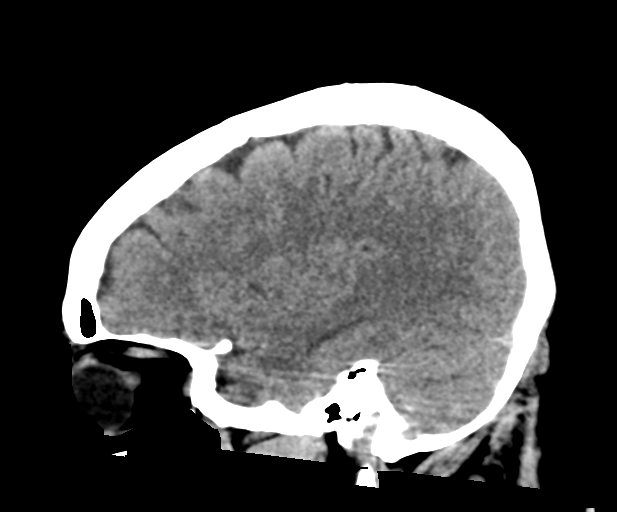
[im 30/60  brain]
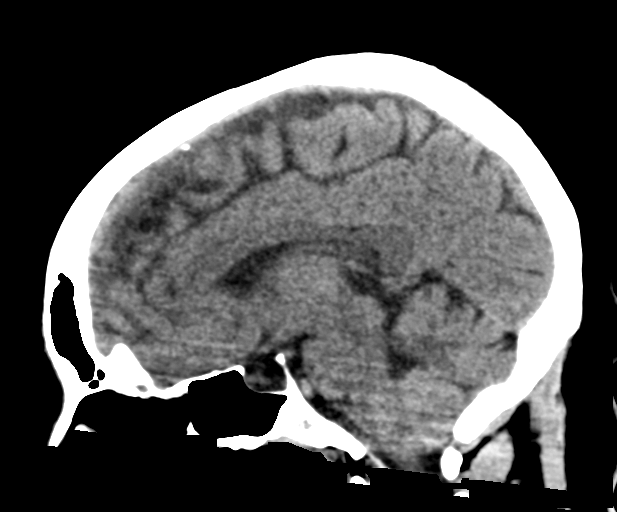
[im 40/60  brain]
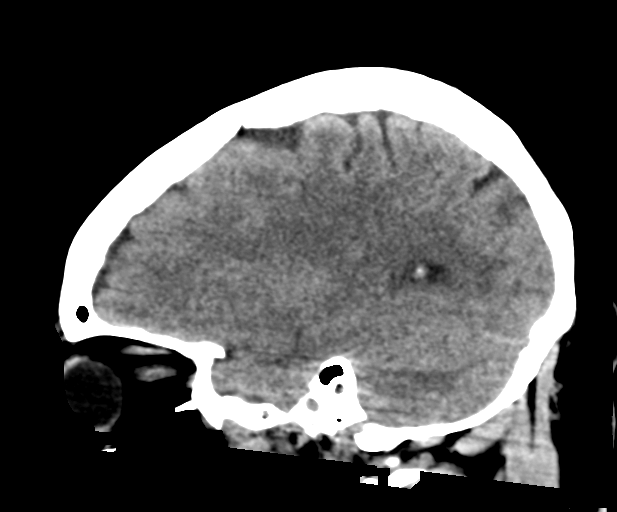

[17 of 47 positions shown; findings below may reference images not displayed]

FINDINGS: Brain:

Cerebral volume is normal.

There is no acute intracranial hemorrhage.

No demarcated cortical infarct.

No extra-axial fluid collection.

No evidence of an intracranial mass.

No midline shift.

Partially empty sella turcica.

Vascular: No hyperdense vessel.

Skull: Normal. Negative for fracture or focal lesion.

Sinuses/Orbits: Visualized orbits show no acute finding. No
significant paranasal sinus disease at the imaged levels.
IMPRESSION: No evidence of acute intracranial abnormality.

## 2022-07-18 ENCOUNTER — Encounter: Payer: Self-pay | Admitting: Family Medicine

## 2022-07-24 ENCOUNTER — Ambulatory Visit
Admission: RE | Admit: 2022-07-24 | Discharge: 2022-07-24 | Disposition: A | Discharge status: Home / Self Care | Payer: BC Managed Care – PPO | Source: Ambulatory Visit | Attending: Family Medicine | Admitting: Family Medicine

## 2022-07-24 ENCOUNTER — Ambulatory Visit
Admission: RE | Admit: 2022-07-24 | Discharge: 2022-07-24 | Disposition: A | Discharge status: Home / Self Care | Payer: PRIVATE HEALTH INSURANCE | Source: Ambulatory Visit | Attending: Family Medicine | Admitting: Family Medicine

## 2022-07-24 DIAGNOSIS — N6320 Unspecified lump in the left breast, unspecified quadrant: Secondary | ICD-10-CM

## 2022-08-06 ENCOUNTER — Other Ambulatory Visit: Payer: Self-pay | Admitting: Family Medicine

## 2022-08-06 DIAGNOSIS — N6320 Unspecified lump in the left breast, unspecified quadrant: Secondary | ICD-10-CM

## 2022-08-06 DIAGNOSIS — N6310 Unspecified lump in the right breast, unspecified quadrant: Secondary | ICD-10-CM

## 2022-08-21 NOTE — Progress Notes (Signed)
Chief Complaint  Patient presents with   Room 1    Pt is here Alone. Pt states that her last Seizure was June of last year. Pt states that things have been going good since last appointment. Pt states no new symptoms to report.      HISTORY OF PRESENT ILLNESS:  08/28/22 ALL:  Sabrina Hoover returns for follow up for seizures. She continues levetiracetam 1500mg  and lamotrigine 100mg  BID. She reports doing well. Last seizure 10/2021. She is tolerating meds well. No missed doses. 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 She stays well hydrated and gets plenty of sleep. She has noticed feeling off balance from time to time. Does not happen often. She is able to dance without difficulty.   02/13/2022 ALL: Sabrina Hoover returns for follow up for seizures. She was last seen by me 05/2021 and doing well on levetiracetam. She called to report seizure 11/13/2021 and levetiracetam dose was increased to 1500mg  BID. She was seen by Dr Vickey Huger 11/15/2021 who added lamotrigine titrating to 100mg  BID and ordered sleep study. Sleep results did not reveal any significant sleep breathing disorders. Since, she reports doing well. No seizure activity. She is tolerating meds well with the exception of mild dizziness for about 5 minutes after taking lamotrigine. She reports dizziness resolves within minutes and is not limiting. She is not taking meds with food. She continues to work.   05/23/2021 ALL: Sabrina Hoover is a 42 y.o. female here today for follow up for previous seizure. She had a witnessed tonic clonic event 11/29/2020. She has continued levetiracetam 1000mg  BID. EEG 12/14/2020 was normal. She has continued to tolerate levetiracetam and denies seizure like activity. She occasionally has word finding difficulty in times of stress or if she is trying to multitask. She does not lose consciousness. She continues to care for her mother who lives with her. She feels mood is stable. She is sleeping fairly well.   HISTORY  (copied from Dr Dohmeier's previous note)  HPI:  Sabrina Hoover is a 42 y.o. singe seizure patient - Pt with mom, rm 11. Presents today post MRI and EEG. Overall has done well/stable. She is on keppra states that she sometimes feels sleepy on it. There have been no seizure like activity according to the pt. Mom reports 2 incidents where she patient had moment of somewhat confusion. She can only recall the second incident,occurred 10/1- she was writing her mom's BP down and she couldn't spell October 1st. She went to write it and said how to you spell oct and then gazed off and came back to and was fine. The first spell was mumbling and lasted 3-5 seconds. That's what makes her keep taking keppra.  MRI was normal. EEG was normal.  She has remained on Keppra-    This female  with african- [place] heritage is seen here upon ED referral [name], MD  Based on the emergency room referral note the patient must have been out for a couple of minutes at least she remembers love being at home watching TV on her couch she did not feel sick and then woke up to find the paramedics were in her home.  Family members and EMS witnessed her mouth twisting, her eyes rolling- they stayed open, she was breathing very hard- and then full body stiffness- trembling ,shaking or tonic-clonic activity and she did  bite her tongue.  She was unresponsive after about 2 minutes of convulsion.  The situation was confusing to her -  EMS , asked repeatedly the same question. There was no incontinence and she has no history of head injury and did not suffer a fall or injury in the seizure.   There is no pertinent past seizure history and there is no family history of epilepsy.  He the patient in the emergency room had normal oxygen levels normal pulse rate blood pressure was 128/79 temperature was normal respiration was regular at 49 minutes not elevated.  She was in no acute distress and well developed.  No peripheral pain.  There was no  extraocular abnormality no facial droop no slurred speech and her mood was normal she did not appear confused to the emergency room staff.  The labs that were taken but very basic hemoglobin MCH platelets basic metabolic panel TSH there was no toxicology screen.  An EKG was not obtained, a CT head without contrast was ordered and again it was a nontraumatic seizure without any focal neurologic abnormalities upon returning to baseline this was a normal CT.  The patient also had to undergo a pregnancy test which was negative.   The day was not unusual a in any way.   REVIEW OF SYSTEMS: Out of a complete 14 system review of symptoms, the patient complains only of the following symptoms, intermittent imbalance and all other reviewed systems are negative.   ALLERGIES: No Known Allergies   HOME MEDICATIONS: Outpatient Medications Prior to Visit  Medication Sig Dispense Refill   lamoTRIgine (LAMICTAL) 100 MG tablet Take 1 tablet (100 mg total) by mouth 2 (two) times daily. 180 tablet 2   levETIRAcetam (KEPPRA) 1000 MG tablet 1.5 tablet in AM and 1.5 tablets at bedtime 270 tablet 1   No facility-administered medications prior to visit.     PAST MEDICAL HISTORY: History reviewed. No pertinent past medical history.   PAST SURGICAL HISTORY: History reviewed. No pertinent surgical history.   FAMILY HISTORY: Family History  Problem Relation Age of Onset   Breast cancer Neg Hx      SOCIAL HISTORY: Social History   Socioeconomic History   Marital status: Unknown    Spouse name: Not on file   Number of children: Not on file   Years of education: Not on file   Highest education level: Not on file  Occupational History   Not on file  Tobacco Use   Smoking status: Never   Smokeless tobacco: Never  Vaping Use   Vaping Use: Never used  Substance and Sexual Activity   Alcohol use: Never   Drug use: Never   Sexual activity: Not on file  Other Topics Concern   Not on file  Social  History Narrative   Right Handed    Drinks Coffee 1-2 per week   Drinks Soda 1 per week   Social Determinants of Health   Financial Resource Strain: Not on file  Food Insecurity: Not on file  Transportation Needs: Not on file  Physical Activity: Not on file  Stress: Not on file  Social Connections: Not on file  Intimate Partner Violence: Not on file     PHYSICAL EXAM  Vitals:   08/28/22 0855  BP: 135/85  Pulse: 75  Weight: 216 lb 8 oz (98.2 kg)  Height:  (1.626 m)     Body mass index is 37.16 kg/m.  Generalized: Well developed, in no acute distress  Cardiology: normal rate and rhythm, no murmur auscultated  Respiratory: clear to auscultation bilaterally    Neurological examination  Mentation: Alert oriented  to time, place, history taking. Follows all commands speech and language fluent Cranial nerve II-XII: Pupils were equal round reactive to light. Extraocular movements were full, visual field were full on confrontational test. Facial sensation and strength were normal. Uvula tongue midline. Head turning and shoulder shrug  were normal and symmetric. Motor: The motor testing reveals 5 over 5 strength of all 4 extremities. Good symmetric motor tone is noted throughout.  Sensory: Sensory testing is intact to soft touch on all 4 extremities. No evidence of extinction is noted.  Coordination: Cerebellar testing reveals good finger-nose-finger and heel-to-shin bilaterally.  Gait and station: Gait is normal.  DTR: normal and symmetric bilaterally     DIAGNOSTIC DATA (LABS, IMAGING, TESTING) - I reviewed patient records, labs, notes, testing and imaging myself where available.  Lab Results  Component Value Date   WBC 6.8 02/13/2022   HGB 10.4 (L) 02/13/2022   HCT 34.0 02/13/2022   MCV 75 (L) 02/13/2022   PLT 489 (H) 02/13/2022      Component Value Date/Time   NA 141 02/13/2022 0953   K 4.5 02/13/2022 0953   CL 105 02/13/2022 0953   CO2 21 02/13/2022 0953    GLUCOSE 83 02/13/2022 0953   GLUCOSE 82 11/29/2020 1548   BUN 9 02/13/2022 0953   CREATININE 1.00 02/13/2022 0953   CALCIUM 9.2 02/13/2022 0953   PROT 7.3 02/13/2022 0953   ALBUMIN 4.4 02/13/2022 0953   AST 15 02/13/2022 0953   ALT 11 02/13/2022 0953   ALKPHOS 55 02/13/2022 0953   BILITOT 0.3 02/13/2022 0953   GFRNONAA >60 11/29/2020 1548   No results found for: "CHOL", "HDL", "LDLCALC", "LDLDIRECT", "TRIG", "CHOLHDL" No results found for: "HGBA1C" No results found for: "VITAMINB12" Lab Results  Component Value Date   TSH 1.081 11/29/2020        No data to display               No data to display           ASSESSMENT AND PLAN  42 y.o. year old female  has no past medical history on file. here with    Generalized convulsive seizures  Anhelica reports no further seizure activity since June 2023. She will to continue levetiracetam 1500mg  and lamotrigine 100mg  BID. She will continue to monitor for any concerns of seizure. Seizure precautions and driving restrictions discussed. She will follow up with me in 1 year.    No orders of the defined types were placed in this encounter.    Meds ordered this encounter  Medications   lamoTRIgine (LAMICTAL) 100 MG tablet    Sig: Take 1 tablet (100 mg total) by mouth 2 (two) times daily.    Dispense:  180 tablet    Refill:  3    Order Specific Question:   Supervising Provider    Answer:   Anson Fret [[id number]]   levETIRAcetam (KEPPRA) 1000 MG tablet    Sig: 1.5 tablet in AM and 1.5 tablets at bedtime    Dispense:  270 tablet    Refill:  3    Order Specific Question:   Supervising Provider    Answer:   Anson Fret [[id number]]      Shawnie Dapper, MSN, FNP-C 08/28/2022, 9:25 AM  [facility] [address] [place], [place] [address] [phone]

## 2022-08-21 NOTE — Patient Instructions (Signed)
Below is our plan:  We will continue levetiracetam 1500mg and lamotrigine 100mg twice daily.   Please make sure you are consistent with timing of seizure medication. I recommend annual visit with primary care provider (PCP) for complete physical and routine blood work. I recommend daily intake of vitamin D (400-800iu) and calcium (800-1000mg) for bone health. Discuss Dexa screening with PCP.   According to [place] law, you can not drive unless you are seizure / syncope free for at least 6 months and under physician's care.  Please maintain precautions. Do not participate in activities where a loss of awareness could harm you or someone else. No swimming alone, no tub bathing, no hot tubs, no driving, no operating motorized vehicles (cars, ATVs, motocycles, etc), lawnmowers, power tools or firearms. No standing at heights, such as rooftops, ladders or stairs. Avoid hot objects such as stoves, heaters, open fires. Wear a helmet when riding a bicycle, scooter, skateboard, etc. and avoid areas of traffic. Set your water heater to 120 degrees or less.  Please make sure you are staying well hydrated. I recommend 50-60 ounces daily. Well balanced diet and regular exercise encouraged. Consistent sleep schedule with 6-8 hours recommended.   Please continue follow up with care team as directed.   Follow up with me in 1 year   You may receive a survey regarding today's visit. I encourage you to leave honest feed back as I do use this information to improve patient care. Thank you for seeing me today!    

## 2022-08-28 ENCOUNTER — Ambulatory Visit: Payer: BC Managed Care – PPO | Admitting: Family Medicine

## 2022-08-28 ENCOUNTER — Encounter: Payer: Self-pay | Admitting: Family Medicine

## 2022-08-28 VITALS — BP 135/85 | HR 75 | Ht 64.0 in | Wt 216.5 lb

## 2022-08-28 DIAGNOSIS — R569 Unspecified convulsions: Secondary | ICD-10-CM

## 2022-08-28 MED ORDER — LAMOTRIGINE 100 MG PO TABS: 100.0000 mg | ORAL_TABLET | Freq: Two times a day (BID) | ORAL | 3 refills | Status: DC

## 2022-08-28 MED ORDER — LEVETIRACETAM 1000 MG PO TABS: ORAL_TABLET | ORAL | 3 refills | Status: DC

## 2022-08-28 NOTE — Progress Notes (Signed)
Rx lamotrigine 100mg  tablet printed. Faxed signed rx to [facility] at [phone]. Received fax confirmation.

## 2023-01-28 ENCOUNTER — Ambulatory Visit
Admission: RE | Admit: 2023-01-28 | Discharge: 2023-01-28 | Disposition: A | Discharge status: Home / Self Care | Payer: BC Managed Care – PPO | Source: Ambulatory Visit | Attending: Family Medicine | Admitting: Family Medicine

## 2023-01-28 DIAGNOSIS — N6320 Unspecified lump in the left breast, unspecified quadrant: Secondary | ICD-10-CM

## 2023-01-28 DIAGNOSIS — N6310 Unspecified lump in the right breast, unspecified quadrant: Secondary | ICD-10-CM

## 2023-08-15 ENCOUNTER — Other Ambulatory Visit: Payer: Self-pay | Admitting: *Deleted

## 2023-08-15 MED ORDER — LEVETIRACETAM 1000 MG PO TABS: ORAL_TABLET | ORAL | 0 refills | Status: DC

## 2023-08-15 MED ORDER — LAMOTRIGINE 100 MG PO TABS: 100.0000 mg | ORAL_TABLET | Freq: Two times a day (BID) | ORAL | 0 refills | Status: DC

## 2023-08-19 ENCOUNTER — Telehealth: Payer: Self-pay | Admitting: Family Medicine

## 2023-08-19 MED ORDER — LAMOTRIGINE 100 MG PO TABS: 100.0000 mg | ORAL_TABLET | Freq: Two times a day (BID) | ORAL | 0 refills | Status: DC

## 2023-08-19 NOTE — Telephone Encounter (Signed)
 Pt said, pharmacy needing physician approval for refill for lamoTRIgine (LAMICTAL) 100 MG tablet

## 2023-08-19 NOTE — Telephone Encounter (Signed)
 Last seen on 08/28/22 Follow up scheduled on 08/28/23 Rx sent, Rx was sent on 08/15/23 however was set on print.

## 2023-08-27 NOTE — Progress Notes (Unsigned)
 No chief complaint on file.    HISTORY OF PRESENT ILLNESS:  08/27/23 ALL:  Sabrina Hoover returns for follow up for seizures. She continues levetiracetam 1500mg  and lamotrigine 100mg  BID.   Lev 1000 versus 750??  08/28/2022 ALL:  Sabrina Hoover returns for follow up for seizures. She continues levetiracetam 1500mg  and lamotrigine 100mg  BID. She reports doing well. Last seizure 10/2021. She is tolerating meds well. No missed doses. 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 She stays well hydrated and gets plenty of sleep. She has noticed feeling off balance from time to time. Does not happen often. She is able to dance without difficulty.   02/13/2022 ALL: Sabrina Hoover returns for follow up for seizures. She was last seen by me 05/2021 and doing well on levetiracetam. She called to report seizure 11/13/2021 and levetiracetam dose was increased to 1500mg  BID. She was seen by Dr Vickey Huger 11/15/2021 who added lamotrigine titrating to 100mg  BID and ordered sleep study. Sleep results did not reveal any significant sleep breathing disorders. Since, she reports doing well. No seizure activity. She is tolerating meds well with the exception of mild dizziness for about 5 minutes after taking lamotrigine. She reports dizziness resolves within minutes and is not limiting. She is not taking meds with food. She continues to work.   05/23/2021 ALL: Sabrina Hoover is a 43 y.o. female here today for follow up for previous seizure. She had a witnessed tonic clonic event 11/29/2020. She has continued levetiracetam 1000mg  BID. EEG 12/14/2020 was normal. She has continued to tolerate levetiracetam and denies seizure like activity. She occasionally has word finding difficulty in times of stress or if she is trying to multitask. She does not lose consciousness. She continues to care for her mother who lives with her. She feels mood is stable. She is sleeping fairly well.   HISTORY (copied from Dr Dohmeier's previous note)  HPI:   Sabrina Hoover is a 43 y.o. singe seizure patient - Pt with mom, rm 11. Presents today post MRI and EEG. Overall has done well/stable. She is on keppra states that she sometimes feels sleepy on it. There have been no seizure like activity according to the pt. Mom reports 2 incidents where she patient had moment of somewhat confusion. She can only recall the second incident,occurred 10/1- she was writing her mom's BP down and she couldn't spell October 1st. She went to write it and said how to you spell oct and then gazed off and came back to and was fine. The first spell was mumbling and lasted 3-5 seconds. That's what makes her keep taking keppra.  MRI was normal. EEG was normal.  She has remained on Keppra-    This female  with african- [place] heritage is seen here upon ED referral Sabrina Dibbles, MD  Based on the emergency room referral note the patient must have been out for a couple of minutes at least she remembers love being at home watching TV on her couch she did not feel sick and then woke up to find the paramedics were in her home.  Family members and EMS witnessed her mouth twisting, her eyes rolling- they stayed open, she was breathing very hard- and then full body stiffness- trembling ,shaking or tonic-clonic activity and she did  bite her tongue.  She was unresponsive after about 2 minutes of convulsion.  The situation was confusing to her - EMS , asked repeatedly the same question. There was no incontinence and she has no history of  head injury and did not suffer a fall or injury in the seizure.   There is no pertinent past seizure history and there is no family history of epilepsy.  He the patient in the emergency room had normal oxygen levels normal pulse rate blood pressure was 128/79 temperature was normal respiration was regular at 49 minutes not elevated.  She was in no acute distress and well developed.  No peripheral pain.  There was no extraocular abnormality no facial droop no slurred  speech and her mood was normal she did not appear confused to the emergency room staff.  The labs that were taken but very basic hemoglobin MCH platelets basic metabolic panel TSH there was no toxicology screen.  An EKG was not obtained, a CT head without contrast was ordered and again it was a nontraumatic seizure without any focal neurologic abnormalities upon returning to baseline this was a normal CT.  The patient also had to undergo a pregnancy test which was negative.   The day was not unusual a in any way.   REVIEW OF SYSTEMS: Out of a complete 14 system review of symptoms, the patient complains only of the following symptoms, intermittent imbalance and all other reviewed systems are negative.   ALLERGIES: No Known Allergies   HOME MEDICATIONS: Outpatient Medications Prior to Visit  Medication Sig Dispense Refill   lamoTRIgine (LAMICTAL) 100 MG tablet Take 1 tablet (100 mg total) by mouth 2 (two) times daily. 180 tablet 0   levETIRAcetam (KEPPRA) 1000 MG tablet 1.5 tablet in AM and 1.5 tablets at bedtime 270 tablet 0   No facility-administered medications prior to visit.     PAST MEDICAL HISTORY: No past medical history on file.   PAST SURGICAL HISTORY: No past surgical history on file.   FAMILY HISTORY: Family History  Problem Relation Age of Onset   Breast cancer Neg Hx      SOCIAL HISTORY: Social History   Socioeconomic History   Marital status: Unknown    Spouse name: Not on file   Number of children: Not on file   Years of education: Not on file   Highest education level: Not on file  Occupational History   Not on file  Tobacco Use   Smoking status: Never   Smokeless tobacco: Never  Vaping Use   Vaping status: Never Used  Substance and Sexual Activity   Alcohol use: Never   Drug use: Never   Sexual activity: Not on file  Other Topics Concern   Not on file  Social History Narrative   Right Handed    Drinks Coffee 1-2 per week   Drinks Soda 1  per week   Social Drivers of Corporate investment banker Strain: Not on file  Food Insecurity: Not on file  Transportation Needs: Not on file  Physical Activity: Not on file  Stress: Not on file  Social Connections: Not on file  Intimate Partner Violence: Not on file     PHYSICAL EXAM  There were no vitals filed for this visit.    There is no height or weight on file to calculate BMI.  Generalized: Well developed, in no acute distress  Cardiology: normal rate and rhythm, no murmur auscultated  Respiratory: clear to auscultation bilaterally    Neurological examination  Mentation: Alert oriented to time, place, history taking. Follows all commands speech and language fluent Cranial nerve II-XII: Pupils were equal round reactive to light. Extraocular movements were full, visual field were full on  confrontational test. Facial sensation and strength were normal. Uvula tongue midline. Head turning and shoulder shrug  were normal and symmetric. Motor: The motor testing reveals 5 over 5 strength of all 4 extremities. Good symmetric motor tone is noted throughout.  Sensory: Sensory testing is intact to soft touch on all 4 extremities. No evidence of extinction is noted.  Coordination: Cerebellar testing reveals good finger-nose-finger and heel-to-shin bilaterally.  Gait and station: Gait is normal.  DTR: normal and symmetric bilaterally     DIAGNOSTIC DATA (LABS, IMAGING, TESTING) - I reviewed patient records, labs, notes, testing and imaging myself where available.  Lab Results  Component Value Date   WBC 6.8 02/13/2022   HGB 10.4 (L) 02/13/2022   HCT 34.0 02/13/2022   MCV 75 (L) 02/13/2022   PLT 489 (H) 02/13/2022      Component Value Date/Time   NA 141 02/13/2022 0953   K 4.5 02/13/2022 0953   CL 105 02/13/2022 0953   CO2 21 02/13/2022 0953   GLUCOSE 83 02/13/2022 0953   GLUCOSE 82 11/29/2020 1548   BUN 9 02/13/2022 0953   CREATININE 1.00 02/13/2022 0953   CALCIUM  9.2 02/13/2022 0953   PROT 7.3 02/13/2022 0953   ALBUMIN 4.4 02/13/2022 0953   AST 15 02/13/2022 0953   ALT 11 02/13/2022 0953   ALKPHOS 55 02/13/2022 0953   BILITOT 0.3 02/13/2022 0953   GFRNONAA >60 11/29/2020 1548   No results found for: "CHOL", "HDL", "LDLCALC", "LDLDIRECT", "TRIG", "CHOLHDL" No results found for: "HGBA1C" No results found for: "VITAMINB12" Lab Results  Component Value Date   TSH 1.081 11/29/2020        No data to display               No data to display           ASSESSMENT AND PLAN  43 y.o. year old female  has no past medical history on file. here with    No diagnosis found.  Elliet reports no further seizure activity since June 2023. She will to continue levetiracetam 1500mg  and lamotrigine 100mg  BID. She will continue to monitor for any concerns of seizure. Seizure precautions and driving restrictions discussed. She will follow up with me in 1 year.    No orders of the defined types were placed in this encounter.    No orders of the defined types were placed in this encounter.     Shawnie Dapper, MSN, FNP-C 08/27/2023, 8:06 AM  [facility] [address], [address] [place], [place] [address] [phone]

## 2023-08-27 NOTE — Patient Instructions (Signed)
 Below is our plan:  We will continue levetiracetam 1500mg  and lamotrigine 100mg  twice daily. We will change the dose of your levetiractam tablet to 750 so you take 2 tablets twice daily. Let me know if you have any concerns or questions.   Please make sure you are consistent with timing of seizure medication. I recommend annual visit with primary care provider (PCP) for complete physical and routine blood work. I recommend daily intake of vitamin D (400-800iu) and calcium (800-1000mg ) for bone health. Discuss Dexa screening with PCP.   According to [place] law, you can not drive unless you are seizure / syncope free for at least 6 months and under physician's care.  Please maintain precautions. Do not participate in activities where a loss of awareness could harm you or someone else. No swimming alone, no tub bathing, no hot tubs, no driving, no operating motorized vehicles (cars, ATVs, motocycles, etc), lawnmowers, power tools or firearms. No standing at heights, such as rooftops, ladders or stairs. Avoid hot objects such as stoves, heaters, open fires. Wear a helmet when riding a bicycle, scooter, skateboard, etc. and avoid areas of traffic. Set your water heater to 120 degrees or less.  SUDEP is the sudden, unexpected death of someone with epilepsy, who was otherwise healthy. In SUDEP cases, no other cause of death is found when an autopsy is done. Each year, more than 1 in 1,000 people with epilepsy die from SUDEP. This is the leading cause of death in people with uncontrolled seizures. Until further answers are available, the best way to prevent SUDEP is to lower your risk by controlling seizures. Research has found that people with all types of epilepsy that experience convulsive seizures can be at risk.  Please make sure you are staying well hydrated. I recommend 50-60 ounces daily. Well balanced diet and regular exercise encouraged. Consistent sleep schedule with 6-8 hours recommended.   Please  continue follow up with care team as directed.   Follow up with me in 1 year   GENERAL HEADACHE INFORMATION:   Natural supplements: Magnesium Oxide or Magnesium Glycinate 500 mg at bed (up to 800 mg daily) Coenzyme Q10 300 mg in AM Vitamin B2- 200 mg twice a day   Add 1 supplement at a time since even natural supplements can have undesirable side effects. You can sometimes buy supplements cheaper (especially Coenzyme Q10) at www.WebmailGuide.co.za or at [facility].  Migraine with aura: There is increased risk for stroke in women with migraine with aura and a contraindication for the combined contraceptive pill for use by women who have migraine with aura. The risk for women with migraine without aura is lower. However other risk factors like smoking are far more likely to increase stroke risk than migraine. There is a recommendation for no smoking and for the use of OCPs without estrogen such as progestogen only pills particularly for women with migraine with aura.Marland Kitchen People who have migraine headaches with auras may be 3 times more likely to have a stroke caused by a blood clot, compared to migraine patients who don't see auras. Women who take hormone-replacement therapy may be 30 percent more likely to suffer a clot-based stroke than women not taking medication containing estrogen. Other risk factors like smoking and high blood pressure may be  much more important.    Vitamins and herbs that show potential:   Magnesium: Magnesium (250 mg twice a day or 500 mg at bed) has a relaxant effect on smooth muscles such as blood vessels.  Individuals suffering from frequent or daily headache usually have low magnesium levels which can be increase with daily supplementation of 400-750 mg. Three trials found 40-90% average headache reduction  when used as a preventative. Magnesium may help with headaches are aura, the best evidence for magnesium is for migraine with aura is its thought to stop the cortical spreading  depression we believe is the pathophysiology of migraine aura.Magnesium also demonstrated the benefit in menstrually related migraine.  Magnesium is part of the messenger system in the serotonin cascade and it is a good muscle relaxant.  It is also useful for constipation which can be a side effect of other medications used to treat migraine. Good sources include nuts, whole grains, and tomatoes. Side Effects: loose stool/diarrhea  Riboflavin (vitamin B 2) 200 mg twice a day. This vitamin assists nerve cells in the production of ATP a principal energy storing molecule.  It is necessary for many chemical reactions in the body.  There have been at least 3 clinical trials of riboflavin using 400 mg per day all of which suggested that migraine frequency can be decreased.  All 3 trials showed significant improvement in over half of migraine sufferers.  The supplement is found in bread, cereal, milk, meat, and poultry.  Most Americans get more riboflavin than the recommended daily allowance, however riboflavin deficiency is not necessary for the supplements to help prevent headache. Side effects: energizing, green urine   Coenzyme Q10: This is present in almost all cells in the body and is critical component for the conversion of energy.  Recent studies have shown that a nutritional supplement of CoQ10 can reduce the frequency of migraine attacks by improving the energy production of cells as with riboflavin.  Doses of 150 mg twice a day have been shown to be effective.   Melatonin: Increasing evidence shows correlation between melatonin secretion and headache conditions.  Melatonin supplementation has decreased headache intensity and duration.  It is widely used as a sleep aid.  Sleep is natures way of dealing with migraine.  A dose of 3 mg is recommended to start for headaches including cluster headache. Higher doses up to 15 mg has been reviewed for use in Cluster headache and have been used. The rationale  behind using melatonin for cluster is that many theories regarding the cause of Cluster headache center around the disruption of the normal circadian rhythm in the brain.  This helps restore the normal circadian rhythm.   HEADACHE DIET: Foods and beverages which may trigger migraine Note that only 20% of headache patients are food sensitive. You will know if you are food sensitive if you get a headache consistently 20 minutes to 2 hours after eating a certain food. Only cut out a food if it causes headaches, otherwise you might remove foods you enjoy! What matters most for diet is to eat a well balanced healthy diet full of vegetables and low fat protein, and to not miss meals.   Chocolate, other sweets ALL cheeses except cottage and cream cheese Dairy products, yogurt, sour cream, ice cream Liver Meat extracts (Bovril, Marmite, meat tenderizers) Meats or fish which have undergone aging, fermenting, pickling or smoking. These include: Hotdogs,salami,Lox,sausage, mortadellas,smoked salmon, pepperoni, Pickled herring Pods of broad bean (English beans, Chinese pea pods, [place] (fava) beans, lima and navy beans Ripe avocado, ripe banana Yeast extracts or active yeast preparations such as Brewer's or Fleishman's (commercial bakes goods are permitted) Tomato based foods, pizza (lasagna, etc.)   MSG (monosodium glutamate) is  disguised as many things; look for these common aliases: Monopotassium glutamate Autolysed yeast Hydrolysed protein Sodium caseinate "flavorings" "all natural preservatives" Nutrasweet   Avoid all other foods that convincingly provoke headaches.   Resources: The Dizzy [place] Laundry Your Headache Diet, migrainestrong.com  https://zamora-andrews.com/   Caffeine and Migraine For patients that have migraine, caffeine intake more than 3 days per week can lead to dependency and increased migraine frequency. I would recommend cutting  back on your caffeine intake as best you can. The recommended amount of caffeine is 200-300 mg daily, although migraine patients may experience dependency at even lower doses. While you may notice an increase in headache temporarily, cutting back will be helpful for headaches in the long run. For more information on caffeine and migraine, visit: https://americanmigrainefoundation.org/resource-library/caffeine-and-migraine/   Headache Prevention Strategies:   1. Maintain a headache diary; learn to identify and avoid triggers.  - This can be a simple note where you log when you had a headache, associated symptoms, and medications used - There are several smartphone apps developed to help track migraines: Migraine Buddy, Migraine Monitor, Curelator N1-Headache App   Common triggers include: Emotional triggers: Emotional/Upset family or friends Emotional/Upset occupation Business reversal/success Anticipation anxiety Crisis-serious Post-crisis periodNew job/position   Physical triggers: Vacation Day Weekend Strenuous Exercise High Altitude Location New Move Menstrual Day Physical Illness Oversleep/Not enough sleep Weather changes Light: Photophobia or light sesnitivity treatment involves a balance between desensitization and reduction in overly strong input. Use dark polarized glasses outside, but not inside. Avoid bright or fluorescent light, but do not dim environment to the point that going into a normally lit room hurts. Consider FL-41 tint lenses, which reduce the most irritating wavelengths without blocking too much light.  These can be obtained at axonoptics.com or theraspecs.com Foods: see list above.   2. Limit use of acute treatments (over-the-counter medications, triptans, etc.) to no more than 2 days per week or 10 days per month to prevent medication overuse headache (rebound headache).     3. Follow a regular schedule (including weekends and holidays): Don't skip meals. Eat  a balanced diet. 8 hours of sleep nightly. Minimize stress. Exercise 30 minutes per day. Being overweight is associated with a 5 times increased risk of chronic migraine. Keep well hydrated and drink 6-8 glasses of water per day.   4. Initiate non-pharmacologic measures at the earliest onset of your headache. Rest and quiet environment. Relax and reduce stress. Breathe2Relax is a free app that can instruct you on    some simple relaxtion and breathing techniques. Http://Dawnbuse.com is a    free website that provides teaching videos on relaxation.  Also, there are  many apps that   can be downloaded for "mindful" relaxation.  An app called YOGA NIDRA will help walk you through mindfulness. Another app called Calm can be downloaded to give you a structured mindfulness guide with daily reminders and skill development. Headspace for guided meditation Mindfulness Based Stress Reduction Online Course: www.palousemindfulness.com Cold compresses.   5. Don't wait!! Take the maximum allowable dosage of prescribed medication at the first sign of migraine.   6. Compliance:  Take prescribed medication regularly as directed and at the first sign of a migraine.   7. Communicate:  Call your physician when problems arise, especially if your headaches change, increase in frequency/severity, or become associated with neurological symptoms (weakness, numbness, slurred speech, etc.). Proceed to emergency room if you experience new or worsening symptoms or symptoms do not resolve, if you have new  neurologic symptoms or if headache is severe, or for any concerning symptom.   8. Headache/pain management therapies: Consider various complementary methods, including medication, behavioral therapy, psychological counselling, biofeedback, massage therapy, acupuncture, dry needling, and other modalities.  Such measures may reduce the need for medications. Counseling for pain management, where patients learn to function and  ignore/minimize their pain, seems to work very well.   9. Recommend changing family's attention and focus away from patient's headaches. Instead, emphasize daily activities. If first question of day is 'How are your headaches/Do you have a headache today?', then patient will constantly think about headaches, thus making them worse. Goal is to re-direct attention away from headaches, toward daily activities and other distractions.   10. Helpful Websites: www.AmericanHeadacheSociety.org PatentHood.ch www.headaches.org TightMarket.nl www.achenet.org   You may receive a survey regarding today's visit. I encourage you to leave honest feed back as I do use this information to improve patient care. Thank you for seeing me today!

## 2023-08-28 ENCOUNTER — Ambulatory Visit: Payer: Self-pay | Admitting: Family Medicine

## 2023-08-28 ENCOUNTER — Encounter: Payer: Self-pay | Admitting: Family Medicine

## 2023-08-28 VITALS — BP 120/77 | HR 78 | Ht 64.0 in | Wt 222.0 lb

## 2023-08-28 DIAGNOSIS — R569 Unspecified convulsions: Secondary | ICD-10-CM

## 2023-08-28 MED ORDER — LEVETIRACETAM 750 MG PO TABS: 1500.0000 mg | ORAL_TABLET | Freq: Two times a day (BID) | ORAL | 3 refills | Status: DC

## 2023-08-28 MED ORDER — LAMOTRIGINE 100 MG PO TABS: 100.0000 mg | ORAL_TABLET | Freq: Two times a day (BID) | ORAL | 3 refills | Status: DC

## 2023-08-29 LAB — COMPREHENSIVE METABOLIC PANEL WITH GFR
ALT: 13 IU/L (ref 0–32)
AST: 19 IU/L (ref 0–40)
Albumin: 4 g/dL (ref 3.9–4.9)
Alkaline Phosphatase: 56 IU/L (ref 44–121)
BUN/Creatinine Ratio: 18 (ref 9–23)
BUN: 17 mg/dL (ref 6–24)
Bilirubin Total: <0.2 mg/dL (ref 0.0–1.2)
CO2: 21 mmol/L (ref 20–29)
Calcium: 8.9 mg/dL (ref 8.7–10.2)
Chloride: 106 mmol/L (ref 96–106)
Creatinine, Ser: 0.92 mg/dL (ref 0.57–1.00)
Globulin, Total: 2.5 g/dL (ref 1.5–4.5)
Glucose: 75 mg/dL (ref 70–99)
Potassium: 4.6 mmol/L (ref 3.5–5.2)
Sodium: 141 mmol/L (ref 134–144)
Total Protein: 6.5 g/dL (ref 6.0–8.5)
eGFR: 80 mL/min/{1.73_m2} (ref >59)

## 2023-08-29 LAB — CBC WITH DIFFERENTIAL/PLATELET
Basophils Absolute: 0.1 10*3/uL (ref 0.0–0.2)
Basos: 1 %
EOS (ABSOLUTE): 0.2 10*3/uL (ref 0.0–0.4)
Eos: 3 %
Hematocrit: 33.6 % — ABNORMAL LOW (ref 34.0–46.6)
Hemoglobin: 10.4 g/dL — ABNORMAL LOW (ref 11.1–15.9)
Immature Grans (Abs): 0 10*3/uL (ref 0.0–0.1)
Immature Granulocytes: 0 %
Lymphocytes Absolute: 1.9 10*3/uL (ref 0.7–3.1)
Lymphs: 31 %
MCH: 25 pg — ABNORMAL LOW (ref 26.6–33.0)
MCHC: 31 g/dL — ABNORMAL LOW (ref 31.5–35.7)
MCV: 81 fL (ref 79–97)
Monocytes Absolute: 0.4 10*3/uL (ref 0.1–0.9)
Monocytes: 6 %
Neutrophils Absolute: 3.7 10*3/uL (ref 1.4–7.0)
Neutrophils: 59 %
Platelets: 448 10*3/uL (ref 150–450)
RBC: 4.16 x10E6/uL (ref 3.77–5.28)
RDW: 16.8 % — ABNORMAL HIGH (ref 11.7–15.4)
WBC: 6.3 10*3/uL (ref 3.4–10.8)

## 2023-08-29 LAB — LAMOTRIGINE LEVEL: Lamotrigine Lvl: 9.2 ug/mL (ref 2.0–20.0)

## 2023-08-29 LAB — LEVETIRACETAM LEVEL: Levetiracetam Lvl: 53.9 ug/mL — ABNORMAL HIGH (ref 10.0–40.0)

## 2023-09-03 ENCOUNTER — Encounter: Payer: Self-pay | Admitting: Family Medicine

## 2023-12-26 ENCOUNTER — Encounter: Payer: Self-pay | Admitting: Family Medicine

## 2023-12-26 ENCOUNTER — Other Ambulatory Visit: Payer: Self-pay | Admitting: Family Medicine

## 2023-12-26 DIAGNOSIS — N63 Unspecified lump in unspecified breast: Secondary | ICD-10-CM

## 2024-01-29 ENCOUNTER — Ambulatory Visit
Admission: RE | Admit: 2024-01-29 | Discharge: 2024-01-29 | Disposition: A | Discharge status: Home / Self Care | Payer: Self-pay | Source: Ambulatory Visit | Attending: Family Medicine | Admitting: Family Medicine

## 2024-01-29 DIAGNOSIS — N63 Unspecified lump in unspecified breast: Secondary | ICD-10-CM

## 2024-05-04 ENCOUNTER — Ambulatory Visit: Admitting: Family Medicine

## 2024-05-04 ENCOUNTER — Encounter: Payer: Self-pay | Admitting: Family Medicine

## 2024-05-04 VITALS — BP 115/78 | HR 92 | Ht 64.0 in | Wt 225.4 lb

## 2024-05-04 DIAGNOSIS — R569 Unspecified convulsions: Secondary | ICD-10-CM

## 2024-05-04 MED ORDER — LEVETIRACETAM 750 MG PO TABS: 1500.0000 mg | ORAL_TABLET | Freq: Two times a day (BID) | ORAL | 3 refills | Status: AC

## 2024-05-04 MED ORDER — LAMOTRIGINE 100 MG PO TABS: 100.0000 mg | ORAL_TABLET | Freq: Two times a day (BID) | ORAL | 3 refills | Status: AC

## 2024-05-04 NOTE — Progress Notes (Signed)
 Chief Complaint  Patient presents with   Follow-up    Pt in room 1. Alone. Here for seizure follow up.     HISTORY OF PRESENT ILLNESS:  05/04/2024 ALL:  Sabrina Hoover returns for follow up for seizures. She was last seen 08/2023 and doing well. We continued levetiracetam  1500mg  and lamotrigine  100mg  BID. Since, she reports doing well. No seizure events since 10/2021. She is tolerating meds well. She reports headaches are stable.   08/28/2023 ALL:  Sabrina Hoover returns for follow up for seizures. She continues levetiracetam  1500mg  and lamotrigine  100mg  BID. She is tolerating ASMs well without adverse effects. No seizure activity. She has had a couple of migraines around menses. Easily treated with OTC analgesics. No obvious aura. She drives without difficulty. She is feeling well.   08/28/2022 ALL:  Sabrina Hoover returns for follow up for seizures. She continues levetiracetam  1500mg  and lamotrigine  100mg  BID. She reports doing well. Last seizure 10/2021. She is tolerating meds well. No missed doses. 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 She stays well hydrated and gets plenty of sleep. She has noticed feeling off balance from time to time. Does not happen often. She is able to dance without difficulty.   02/13/2022 ALL: Sabrina Hoover returns for follow up for seizures. She was last seen by me 05/2021 and doing well on levetiracetam . She called to report seizure 11/13/2021 and levetiracetam  dose was increased to 1500mg  BID. She was seen by Dr Chalice 11/15/2021 who added lamotrigine  titrating to 100mg  BID and ordered sleep study. Sleep results did not reveal any significant sleep breathing disorders. Since, she reports doing well. No seizure activity. She is tolerating meds well with the exception of mild dizziness for about 5 minutes after taking lamotrigine . She reports dizziness resolves within minutes and is not limiting. She is not taking meds with food. She continues to work.   05/23/2021 ALL: Sabrina Hoover  is a 43 y.o. female here today for follow up for previous seizure. She had a witnessed tonic clonic event 11/29/2020. She has continued levetiracetam  1000mg  BID. EEG 12/14/2020 was normal. She has continued to tolerate levetiracetam  and denies seizure like activity. She occasionally has word finding difficulty in times of stress or if she is trying to multitask. She does not lose consciousness. She continues to care for her mother who lives with her. She feels mood is stable. She is sleeping fairly well.   HISTORY (copied from Dr Dohmeier's previous note)  HPI:  Sabrina Hoover is a 43 y.o. singe seizure patient - Pt with mom, rm 11. Presents today post MRI and EEG. Overall has done well/stable. She is on keppra  states that she sometimes feels sleepy on it. There have been no seizure like activity according to the pt. Mom reports 2 incidents where she patient had moment of somewhat confusion. She can only recall the second incident,occurred 10/1- she was writing her mom's BP down and she couldn't spell October 1st. She went to write it and said how to you spell oct and then gazed off and came back to and was fine. The first spell was mumbling and lasted 3-5 seconds. That's what makes her keep taking keppra .  MRI was normal. EEG was normal.  She has remained on Keppra -    This female  with african- american heritage is seen here upon ED referral Sabrina Fetters, MD  Based on the emergency room referral note the patient must have been out for a couple of minutes at least she remembers love  being at home watching TV on her couch she did not feel sick and then woke up to find the paramedics were in her home.  Family members and EMS witnessed her mouth twisting, her eyes rolling- they stayed open, she was breathing very hard- and then full body stiffness- trembling ,shaking or tonic-clonic activity and she did  bite her tongue.  She was unresponsive after about 2 minutes of convulsion.  The situation was confusing to her  - EMS , asked repeatedly the same question. There was no incontinence and she has no history of head injury and did not suffer a fall or injury in the seizure.   There is no pertinent past seizure history and there is no family history of epilepsy.  He the patient in the emergency room had normal oxygen levels normal pulse rate blood pressure was 128/79 temperature was normal respiration was regular at 49 minutes not elevated.  She was in no acute distress and well developed.  No peripheral pain.  There was no extraocular abnormality no facial droop no slurred speech and her mood was normal she did not appear confused to the emergency room staff.  The labs that were taken but very basic hemoglobin MCH platelets basic metabolic panel TSH there was no toxicology screen.  An EKG was not obtained, a CT head without contrast was ordered and again it was a nontraumatic seizure without any focal neurologic abnormalities upon returning to baseline this was a normal CT.  The patient also had to undergo a pregnancy test which was negative.   The day was not unusual a in any way.   REVIEW OF SYSTEMS: Out of a complete 14 system review of symptoms, the patient complains only of the following symptoms, headaches, intermittent imbalance and all other reviewed systems are negative.   ALLERGIES: No Known Allergies   HOME MEDICATIONS: Outpatient Medications Prior to Visit  Medication Sig Dispense Refill   lamoTRIgine  (LAMICTAL ) 100 MG tablet Take 1 tablet (100 mg total) by mouth 2 (two) times daily. 180 tablet 3   levETIRAcetam  (KEPPRA ) 750 MG tablet Take 2 tablets (1,500 mg total) by mouth 2 (two) times daily. 360 tablet 3   No facility-administered medications prior to visit.     PAST MEDICAL HISTORY: History reviewed. No pertinent past medical history.   PAST SURGICAL HISTORY: History reviewed. No pertinent surgical history.   FAMILY HISTORY: Family History  Problem Relation Age of Onset    Breast cancer Neg Hx    Seizures Neg Hx      SOCIAL HISTORY: Social History   Socioeconomic History   Marital status: Unknown    Spouse name: Not on file   Number of children: Not on file   Years of education: Not on file   Highest education level: Not on file  Occupational History   Not on file  Tobacco Use   Smoking status: Never   Smokeless tobacco: Never  Vaping Use   Vaping status: Never Used  Substance and Sexual Activity   Alcohol use: Never   Drug use: Never   Sexual activity: Not on file  Other Topics Concern   Not on file  Social History Narrative   Right Handed    Drinks Coffee 1-2 per week   Drinks Soda 1 per week   Pt lives with family   Pt works    Social Drivers of Health   Tobacco Use: Low Risk (05/04/2024)   Patient History    Smoking Tobacco Use:  Never    Smokeless Tobacco Use: Never    Passive Exposure: Not on file  Financial Resource Strain: Not on file  Food Insecurity: Not on file  Transportation Needs: Not on file  Physical Activity: Not on file  Stress: Not on file  Social Connections: Not on file  Intimate Partner Violence: Not on file  Depression (EYV7-0): Not on file  Alcohol Screen: Not on file  Housing: Not on file  Utilities: Not on file  Health Literacy: Not on file     PHYSICAL EXAM  Vitals:   05/04/24 0943  BP: 115/78  Pulse: 92  SpO2: 99%  Weight: 225 lb 6.4 oz (102.2 kg)  Height: 5' 4 (1.626 m)       Body mass index is 38.69 kg/m.  Generalized: Well developed, in no acute distress  Cardiology: normal rate and rhythm, no murmur auscultated  Respiratory: clear to auscultation bilaterally    Neurological examination  Mentation: Alert oriented to time, place, history taking. Follows all commands speech and language fluent Cranial nerve II-XII: Pupils were equal round reactive to light. Extraocular movements were full, visual field were full on confrontational test. Facial sensation and strength were  normal. Uvula tongue midline. Head turning and shoulder shrug  were normal and symmetric. Motor: The motor testing reveals 5 over 5 strength of all 4 extremities. Good symmetric motor tone is noted throughout.  Sensory: Sensory testing is intact to soft touch on all 4 extremities. No evidence of extinction is noted.  Coordination: Cerebellar testing reveals good finger-nose-finger and heel-to-shin bilaterally.  Gait and station: Gait is normal.  DTR: normal and symmetric bilaterally     DIAGNOSTIC DATA (LABS, IMAGING, TESTING) - I reviewed patient records, labs, notes, testing and imaging myself where available.  Lab Results  Component Value Date   WBC 6.3 08/28/2023   HGB 10.4 (L) 08/28/2023   HCT 33.6 (L) 08/28/2023   MCV 81 08/28/2023   PLT 448 08/28/2023      Component Value Date/Time   NA 141 08/28/2023 1018   K 4.6 08/28/2023 1018   CL 106 08/28/2023 1018   CO2 21 08/28/2023 1018   GLUCOSE 75 08/28/2023 1018   GLUCOSE 82 11/29/2020 1548   BUN 17 08/28/2023 1018   CREATININE 0.92 08/28/2023 1018   CALCIUM 8.9 08/28/2023 1018   PROT 6.5 08/28/2023 1018   ALBUMIN 4.0 08/28/2023 1018   AST 19 08/28/2023 1018   ALT 13 08/28/2023 1018   ALKPHOS 56 08/28/2023 1018   BILITOT <0.2 08/28/2023 1018   GFRNONAA >60 11/29/2020 1548   No results found for: CHOL, HDL, LDLCALC, LDLDIRECT, TRIG, CHOLHDL No results found for: YHAJ8R No results found for: VITAMINB12 Lab Results  Component Value Date   TSH 1.081 11/29/2020        No data to display               No data to display           ASSESSMENT AND PLAN  43 y.o. year old female  has no past medical history on file. here with    Generalized convulsive seizures (HCC)  Elvera reports doing well. Last seizure 10/2021. She will to continue levetiracetam  1500mg  and lamotrigine  100mg  BID. She will continue to monitor for any concerns of seizure. Seizure precautions and driving restrictions  discussed. Migraine education provided. Can try Aleve 3-5 days prior to menstrual migraine. She will follow up with me in 1 year.    No orders  of the defined types were placed in this encounter.    Meds ordered this encounter  Medications   levETIRAcetam  (KEPPRA ) 750 MG tablet    Sig: Take 2 tablets (1,500 mg total) by mouth 2 (two) times daily.    Dispense:  360 tablet    Refill:  3    Supervising Provider:   YAN, YIJUN [[id number]]   lamoTRIgine  (LAMICTAL ) 100 MG tablet    Sig: Take 1 tablet (100 mg total) by mouth 2 (two) times daily.    Dispense:  180 tablet    Refill:  3    Supervising Provider:   YAN, YIJUN [[id number]]     I spent 30 minutes of face-to-face and non-face-to-face time with patient.  This included previsit chart review, lab review, study review, order entry, electronic health record documentation, patient education.   Greig Forbes, MSN, FNP-C 05/04/2024, 10:06 AM  [facility] [address] [place], [place] [address] [phone]

## 2024-05-04 NOTE — Patient Instructions (Signed)
 Below is our plan:  We will continue levetiracetam  1500mg  and lamotrigine  100mg  BID.  Please make sure you are consistent with timing of seizure medication. I recommend annual visit with primary care provider (PCP) for complete physical and routine blood work. I recommend daily intake of vitamin D (400-800iu) and calcium (800-1000mg ) for bone health. Discuss Dexa screening with PCP.   According to [place] law, you can not drive unless you are seizure / syncope free for at least 6 months and under physician's care.  Please maintain precautions. Do not participate in activities where a loss of awareness could harm you or someone else. No swimming alone, no tub bathing, no hot tubs, no driving, no operating motorized vehicles (cars, ATVs, motocycles, etc), lawnmowers, power tools or firearms. No standing at heights, such as rooftops, ladders or stairs. Avoid hot objects such as stoves, heaters, open fires. Wear a helmet when riding a bicycle, scooter, skateboard, etc. and avoid areas of traffic. Set your water heater to 120 degrees or less.  SUDEP is the sudden, unexpected death of someone with epilepsy, who was otherwise healthy. In SUDEP cases, no other cause of death is found when an autopsy is done. Each year, more than 1 in 1,000 people with epilepsy die from SUDEP. This is the leading cause of death in people with uncontrolled seizures. Until further answers are available, the best way to prevent SUDEP is to lower your risk by controlling seizures. Research has found that people with all types of epilepsy that experience convulsive seizures can be at risk.  Please make sure you are staying well hydrated. I recommend 50-60 ounces daily. Well balanced diet and regular exercise encouraged. Consistent sleep schedule with 6-8 hours recommended.   Please continue follow up with care team as directed.   Follow up with me in 1 year   You may receive a survey regarding today's visit. I encourage you to leave  honest feed back as I do use this information to improve patient care. Thank you for seeing me today!

## 2024-09-02 ENCOUNTER — Ambulatory Visit: Admitting: Family Medicine

## 2025-03-15 ENCOUNTER — Ambulatory Visit: Admitting: Family Medicine

## 2025-05-17 ENCOUNTER — Ambulatory Visit: Admitting: Family Medicine
